# Patient Record
Sex: Female | Born: 1950 | Race: Black or African American | Hispanic: No | Smoking: Never smoker
Health system: Southern US, Community
[De-identification: ages and names within clinical notes are randomized; demographics above are authoritative.]

## PROBLEM LIST (undated history)

## (undated) DIAGNOSIS — E7800 Pure hypercholesterolemia, unspecified: Secondary | ICD-10-CM

## (undated) DIAGNOSIS — K635 Polyp of colon: Secondary | ICD-10-CM

## (undated) DIAGNOSIS — K219 Gastro-esophageal reflux disease without esophagitis: Secondary | ICD-10-CM

## (undated) DIAGNOSIS — M7120 Synovial cyst of popliteal space [Baker], unspecified knee: Secondary | ICD-10-CM

## (undated) DIAGNOSIS — I10 Essential (primary) hypertension: Secondary | ICD-10-CM

## (undated) DIAGNOSIS — M4800 Spinal stenosis, site unspecified: Secondary | ICD-10-CM

## (undated) HISTORY — PX: HERNIA REPAIR: SHX51

## (undated) HISTORY — DX: Gastro-esophageal reflux disease without esophagitis: K21.9

## (undated) HISTORY — PX: BREAST BIOPSY: SHX20

## (undated) HISTORY — DX: Synovial cyst of popliteal space (Baker), unspecified knee: M71.20

## (undated) HISTORY — DX: Polyp of colon: K63.5

## (undated) HISTORY — DX: Spinal stenosis, site unspecified: M48.00

---

## 2003-08-15 ENCOUNTER — Other Ambulatory Visit: Payer: Self-pay

## 2003-11-12 ENCOUNTER — Other Ambulatory Visit: Payer: Self-pay

## 2006-01-11 ENCOUNTER — Emergency Department: Payer: Self-pay | Admitting: Emergency Medicine

## 2006-01-23 ENCOUNTER — Emergency Department: Payer: Self-pay | Admitting: Emergency Medicine

## 2006-02-23 ENCOUNTER — Emergency Department: Payer: Self-pay | Admitting: Emergency Medicine

## 2006-04-29 ENCOUNTER — Emergency Department: Payer: Self-pay | Admitting: Emergency Medicine

## 2006-06-09 ENCOUNTER — Emergency Department: Payer: Self-pay | Admitting: Emergency Medicine

## 2006-07-07 DIAGNOSIS — I10 Essential (primary) hypertension: Secondary | ICD-10-CM | POA: Insufficient documentation

## 2007-04-13 ENCOUNTER — Emergency Department: Payer: Self-pay | Admitting: Emergency Medicine

## 2007-04-29 ENCOUNTER — Emergency Department: Payer: Self-pay | Admitting: Emergency Medicine

## 2007-05-03 ENCOUNTER — Ambulatory Visit: Payer: Self-pay

## 2007-11-06 ENCOUNTER — Emergency Department: Payer: Self-pay | Admitting: Emergency Medicine

## 2008-05-14 ENCOUNTER — Emergency Department: Payer: Self-pay | Admitting: Emergency Medicine

## 2008-07-09 ENCOUNTER — Observation Stay: Payer: Self-pay | Admitting: Specialist

## 2008-08-01 ENCOUNTER — Ambulatory Visit: Payer: Self-pay | Admitting: Internal Medicine

## 2008-08-09 ENCOUNTER — Emergency Department: Payer: Self-pay | Admitting: Emergency Medicine

## 2010-01-12 ENCOUNTER — Ambulatory Visit: Payer: Self-pay | Admitting: Family Medicine

## 2011-03-09 ENCOUNTER — Ambulatory Visit: Payer: Self-pay | Admitting: Family Medicine

## 2011-10-05 ENCOUNTER — Emergency Department: Payer: Self-pay

## 2011-10-05 LAB — BASIC METABOLIC PANEL
Anion Gap: 10 (ref 7–16)
BUN: 11 mg/dL (ref 7–18)
Calcium, Total: 9 mg/dL (ref 8.5–10.1)
Chloride: 104 mmol/L (ref 98–107)
Co2: 24 mmol/L (ref 21–32)
Creatinine: 0.84 mg/dL (ref 0.60–1.30)
EGFR (African American): >60
EGFR (Non-African Amer.): >60
Glucose: 128 mg/dL — ABNORMAL HIGH (ref 65–99)
Osmolality: 277 (ref 275–301)
Potassium: 4.1 mmol/L (ref 3.5–5.1)
Sodium: 138 mmol/L (ref 136–145)

## 2011-10-05 LAB — TROPONIN I: Troponin-I: <0.02 ng/mL

## 2011-10-05 LAB — CBC
HCT: 45.5 % (ref 35.0–47.0)
HGB: 15.1 g/dL (ref 12.0–16.0)
MCH: 31.1 pg (ref 26.0–34.0)
MCHC: 33.3 g/dL (ref 32.0–36.0)
MCV: 93 fL (ref 80–100)
Platelet: 210 10*3/uL (ref 150–440)
RBC: 4.87 10*6/uL (ref 3.80–5.20)
RDW: 13.4 % (ref 11.5–14.5)
WBC: 4.8 10*3/uL (ref 3.6–11.0)

## 2012-08-21 ENCOUNTER — Ambulatory Visit: Payer: Self-pay | Admitting: Nurse Practitioner

## 2012-11-22 DIAGNOSIS — R768 Other specified abnormal immunological findings in serum: Secondary | ICD-10-CM | POA: Insufficient documentation

## 2013-01-17 DIAGNOSIS — M79643 Pain in unspecified hand: Secondary | ICD-10-CM | POA: Insufficient documentation

## 2013-01-17 DIAGNOSIS — K219 Gastro-esophageal reflux disease without esophagitis: Secondary | ICD-10-CM | POA: Insufficient documentation

## 2013-01-31 ENCOUNTER — Ambulatory Visit: Payer: Self-pay | Admitting: Family Medicine

## 2013-04-23 IMAGING — CR DG KNEE COMPLETE 4+V*R*
1 series · 4 of 4 positions shown · non-contrast
Comparison: none

REASON FOR EXAM: KNEE PAIN
COMMENTS:

PROCEDURE:     DXR - DXR KNEE RT COMP WITH OBLIQUES  - March 09, 2011 [DATE]
RESULT:     No fracture, dislocation or other acute bony abnormality is
identified. There is slight arthritic spurring at the knee medially and
laterally. The knee joint space is well maintained. The patella is intact.

[Series 1: view not recorded · 0.17mm/px · 4 of 4 slices shown]
[im 1/4]
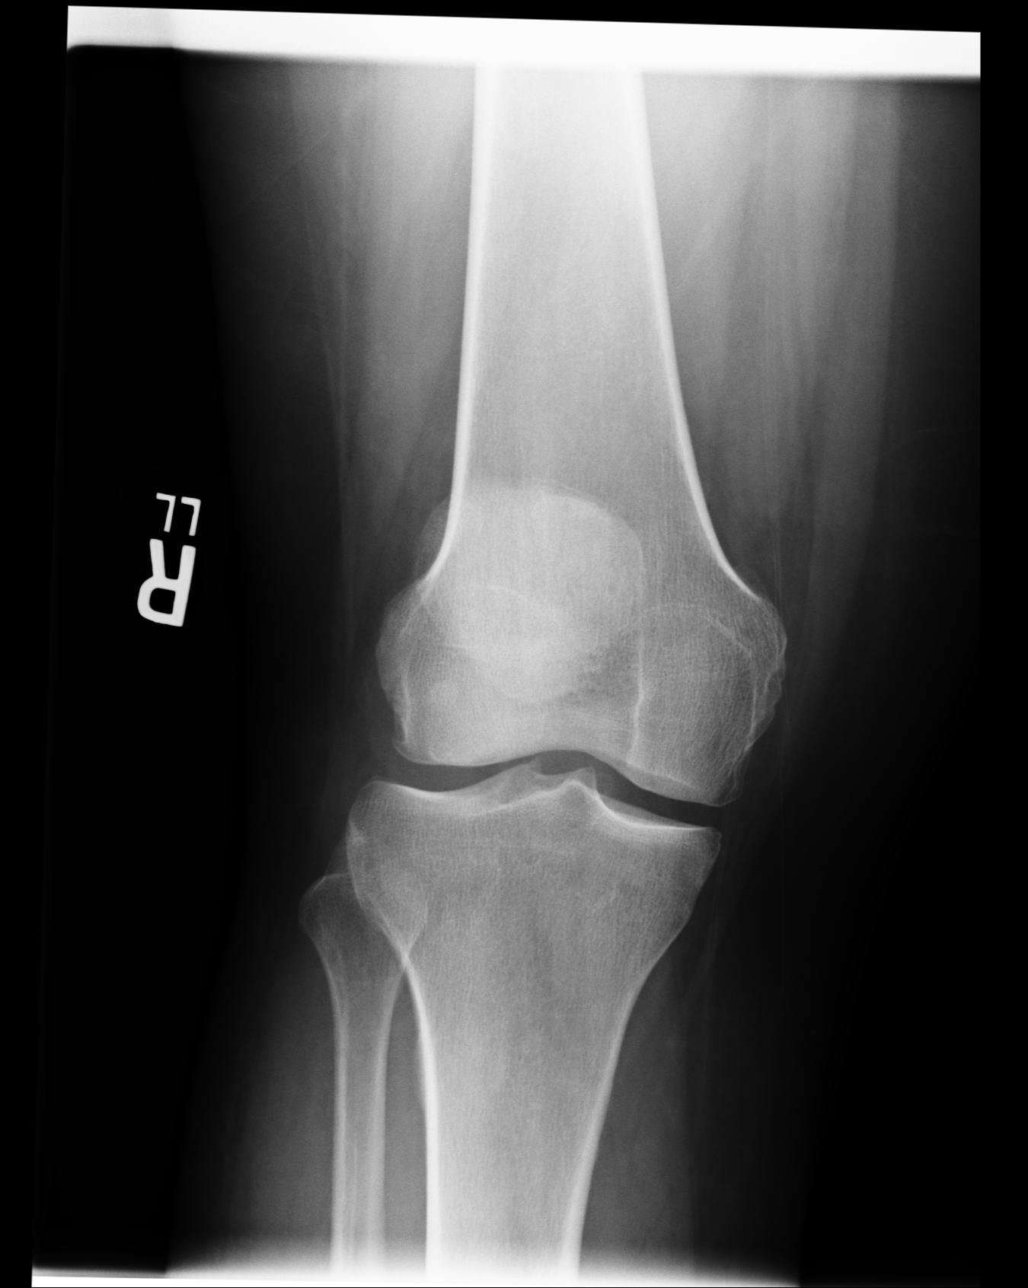
[im 2/4]
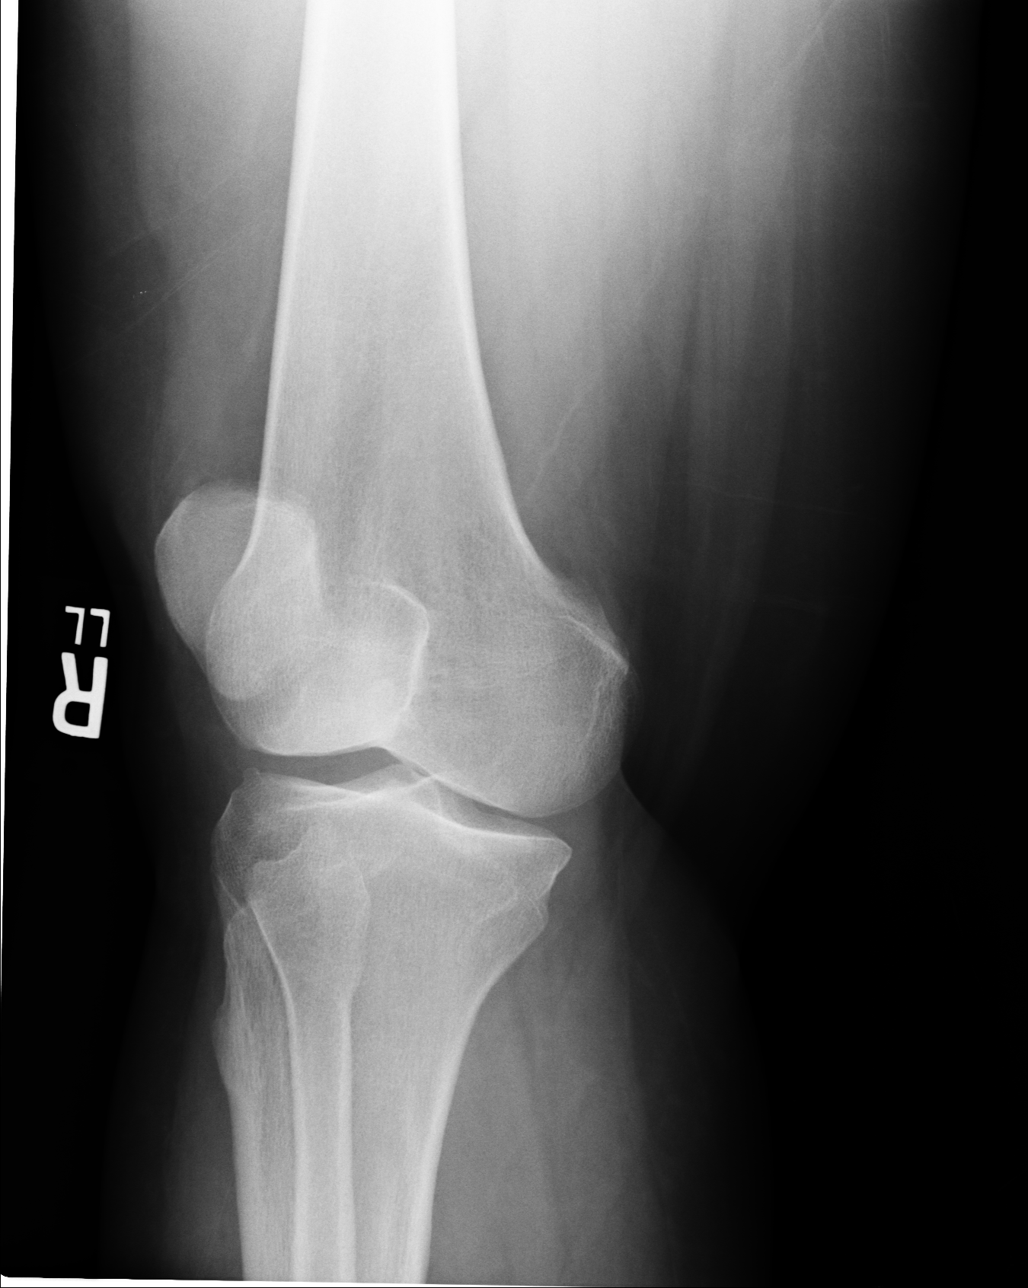
[im 3/4]
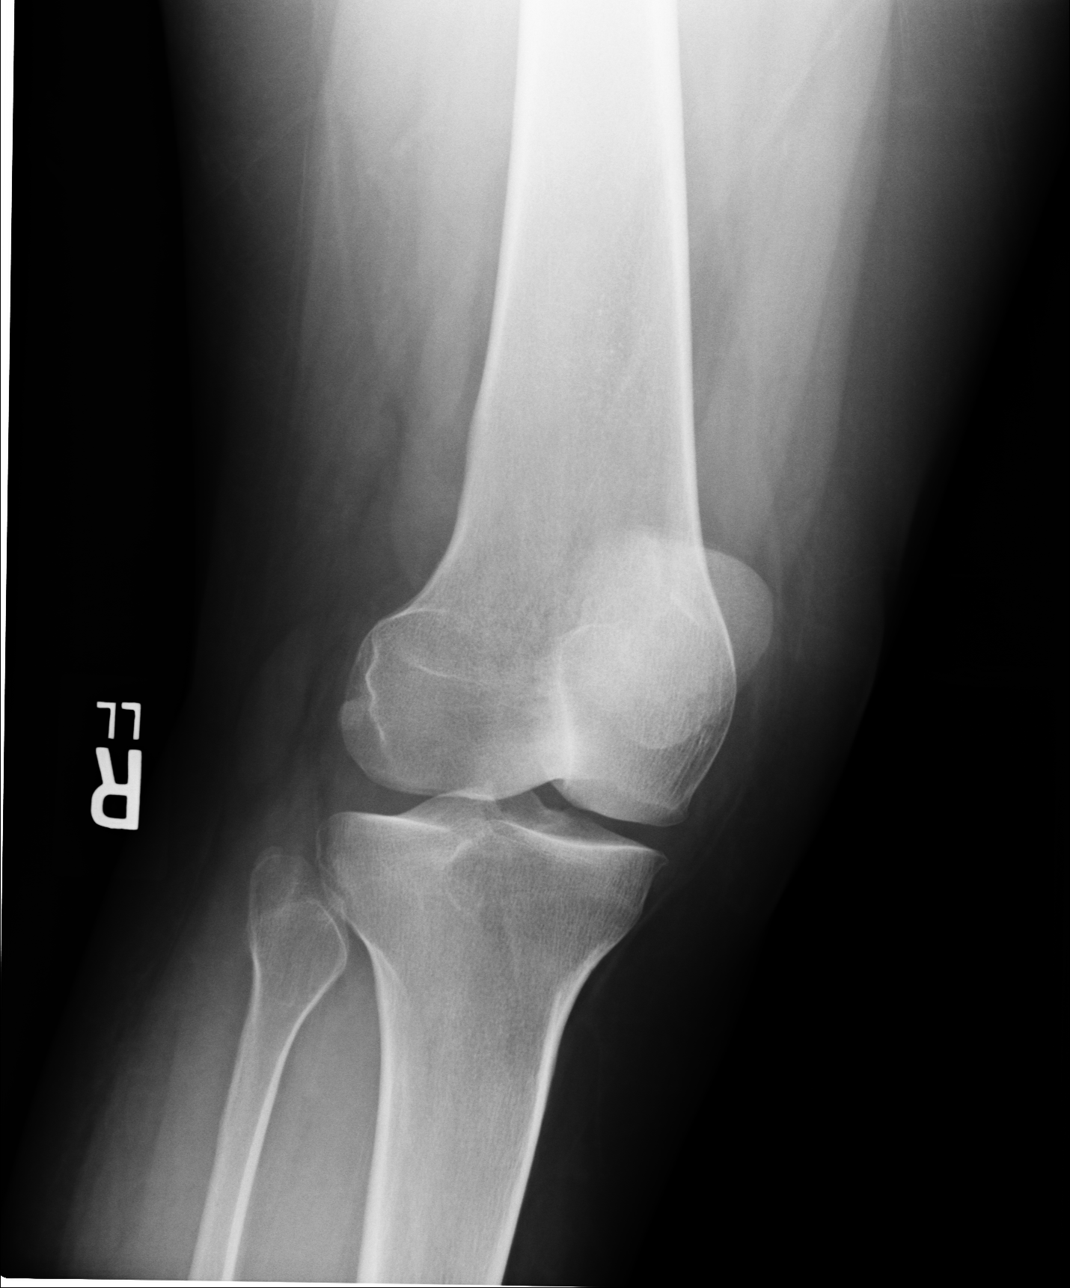
[im 4/4]
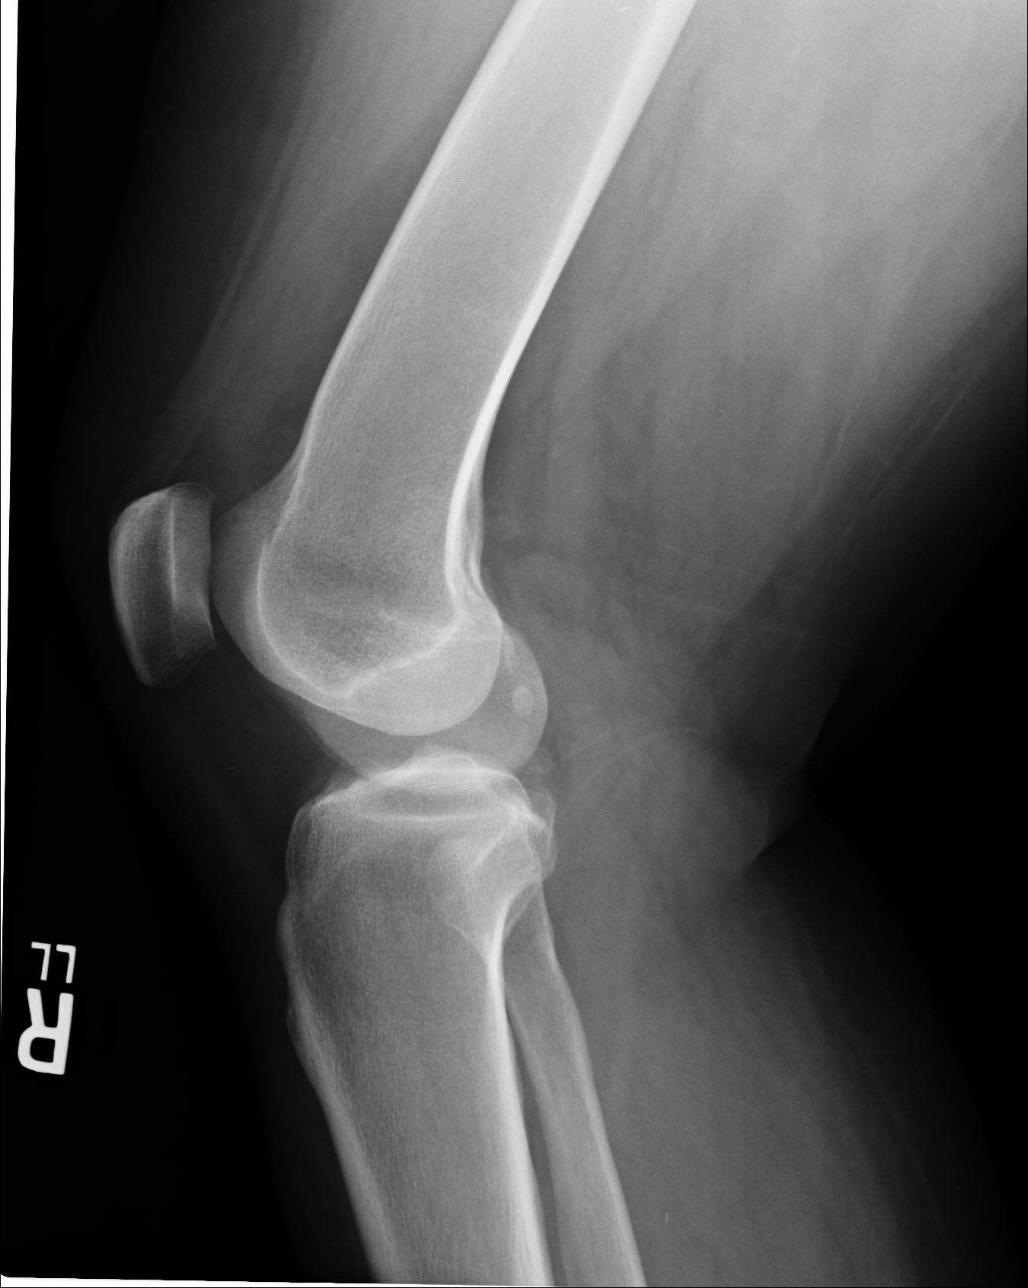

[4 of 4 positions shown; findings below may reference images not displayed]

IMPRESSION: 1. No fracture or other acute bony abnormality is identified.
2. The knee joint space is well-maintained.
3. There is slight arthritic spurring at the knee joint medially and
laterally.

## 2015-01-05 ENCOUNTER — Other Ambulatory Visit: Payer: Self-pay | Admitting: Nurse Practitioner

## 2015-01-05 DIAGNOSIS — Z1239 Encounter for other screening for malignant neoplasm of breast: Secondary | ICD-10-CM

## 2015-01-07 ENCOUNTER — Emergency Department: Payer: No Typology Code available for payment source

## 2015-01-07 ENCOUNTER — Encounter: Payer: Self-pay | Admitting: *Deleted

## 2015-01-07 ENCOUNTER — Emergency Department
Admission: EM | Admit: 2015-01-07 | Discharge: 2015-01-07 | Disposition: A | Discharge status: Home / Self Care | Payer: No Typology Code available for payment source | Attending: Emergency Medicine | Admitting: Emergency Medicine

## 2015-01-07 DIAGNOSIS — I10 Essential (primary) hypertension: Secondary | ICD-10-CM | POA: Diagnosis not present

## 2015-01-07 DIAGNOSIS — S8001XA Contusion of right knee, initial encounter: Secondary | ICD-10-CM

## 2015-01-07 DIAGNOSIS — Y9301 Activity, walking, marching and hiking: Secondary | ICD-10-CM | POA: Insufficient documentation

## 2015-01-07 DIAGNOSIS — S0012XA Contusion of left eyelid and periocular area, initial encounter: Secondary | ICD-10-CM | POA: Insufficient documentation

## 2015-01-07 DIAGNOSIS — Z880 Allergy status to penicillin: Secondary | ICD-10-CM | POA: Diagnosis not present

## 2015-01-07 DIAGNOSIS — S0083XA Contusion of other part of head, initial encounter: Secondary | ICD-10-CM

## 2015-01-07 DIAGNOSIS — Y998 Other external cause status: Secondary | ICD-10-CM | POA: Insufficient documentation

## 2015-01-07 DIAGNOSIS — Y9289 Other specified places as the place of occurrence of the external cause: Secondary | ICD-10-CM | POA: Insufficient documentation

## 2015-01-07 DIAGNOSIS — S0990XA Unspecified injury of head, initial encounter: Secondary | ICD-10-CM | POA: Diagnosis present

## 2015-01-07 DIAGNOSIS — W108XXA Fall (on) (from) other stairs and steps, initial encounter: Secondary | ICD-10-CM | POA: Diagnosis not present

## 2015-01-07 HISTORY — DX: Essential (primary) hypertension: I10

## 2015-01-07 MED ORDER — KETOROLAC TROMETHAMINE 60 MG/2ML IM SOLN
60.0000 mg | Freq: Once | INTRAMUSCULAR | Status: AC
  Administered 2015-01-07: 60 mg via INTRAMUSCULAR
  Filled 2015-01-07: qty 2

## 2015-01-07 MED ORDER — IBUPROFEN 800 MG PO TABS: 800.0000 mg | ORAL_TABLET | Freq: Three times a day (TID) | ORAL | Status: DC | PRN

## 2015-01-07 MED ORDER — TRAMADOL HCL 50 MG PO TABS
50.0000 mg | ORAL_TABLET | Freq: Four times a day (QID) | ORAL | Status: DC | PRN
Start: 2015-01-07 — End: 2015-07-17

## 2015-01-07 NOTE — ED Notes (Signed)
Pt here after falling from standing position.  Pt was walking to her car and her leg "gave out".  Pt has abrasion to left knee and hematoma to forehead. Denis any LOC.

## 2015-01-07 NOTE — ED Provider Notes (Signed)
[facility] Emergency Department Provider Note  ____________________________________________  Time seen: Approximately 1:38 PM  I have reviewed the triage vital signs and the nursing notes.   HISTORY  Chief Complaint Fall    HPI DEDEE Delacruz is a 64 y.o. female plane of pain and hematoma to the forehead and also pain to the right knee secondary to a fall. Patient states she was walking up steps on her right knee gave way and she fell forward landed on the knee and striking her head on the steps. She denies any loss of consciousness but noticed there is a large hematoma above her left eye. Patient denies any vision problems or vertigo. Patient states she takes aspirin every day for blood pressure medication. Patient is rating her pain as a 10 over 10 in the facial area. Patient rated the pain as a 6/10. No palliative measures except for ice applied to the hematoma for this complaint.   Past Medical History  Diagnosis Date  . Hypertension     There are no active problems to display for this patient.   Past Surgical History  Procedure Laterality Date  . Cesarean section    . Hernia repair      Current Outpatient Rx  Name  Route  Sig  Dispense  Refill  . ibuprofen (ADVIL,MOTRIN) 800 MG tablet   Oral   Take 1 tablet (800 mg total) by mouth every 8 (eight) hours as needed for moderate pain.   15 tablet   0   . traMADol (ULTRAM) 50 MG tablet   Oral   Take 1 tablet (50 mg total) by mouth every 6 (six) hours as needed for moderate pain.   12 tablet   0     Allergies Penicillins and Sulfa antibiotics  No family history on file.  Social History History  Substance Use Topics  . Smoking status: Never Smoker   . Smokeless tobacco: Not on file  . Alcohol Use: No    Review of Systems Constitutional: No fever/chills Eyes: No visual changes. ENT: No sore throat. Cardiovascular: Denies chest pain. Respiratory: Denies shortness of  breath. Gastrointestinal: No abdominal pain.  No nausea, no vomiting.  No diarrhea.  No constipation. Genitourinary: Negative for dysuria. Musculoskeletal: Right knee pain Skin: Negative for rash. Hematoma above the left eyebrow. Neurological: Positive for headaches, but denies focal weakness or numbness. Endocrine:Hypertension Hematological/Lymphatic: 10-point ROS otherwise negative.  ____________________________________________   PHYSICAL EXAM:  VITAL SIGNS: ED Triage Vitals  Enc Vitals Group     BP 01/07/15 1249 141/81 mmHg     Pulse Rate 01/07/15 1249 80     Resp --      Temp 01/07/15 1249 98.1 F (36.7 C)     Temp Source 01/07/15 1249 Oral     SpO2 01/07/15 1249 97 %     Weight 01/07/15 1249 240 lb (108.863 kg)     Height 01/07/15 1249 5\' 7"  (1.702 m)     Head Cir --      Peak Flow --      Pain Score 01/07/15 1249 10     Pain Loc --      Pain Edu? --      Excl. in [place]? --     Constitutional: Alert and oriented. Well appearing and in no acute distress. Eyes: Conjunctivae are normal. PERRL. EOMI. Head: 4 cm hematoma above the left eyebrow. Nose: No congestion/rhinnorhea. Mouth/Throat: Mucous membranes are moist.  Oropharynx non-erythematous. Neck: No stridor. No  cervical spine tenderness to palpation. Hematological/Lymphatic/Immunilogical: No cervical lymphadenopathy. Cardiovascular: Normal rate, regular rhythm. Grossly normal heart sounds.  Good peripheral circulation. Respiratory: Normal respiratory effort.  No retractions. Lungs CTAB. Gastrointestinal: Soft and nontender. No distention. No abdominal bruits. No CVA tenderness. Musculoskeletal: Right lower extremity tenderness and edema right anterior knee.  No joint effusions. Neurologic:  Normal speech and language. No gross focal neurologic deficits are appreciated. No gait instability. Skin:  Skin is warm, dry and intact. No rash noted. Abrasions to right knee Psychiatric: Mood and affect are normal. Speech  and behavior are normal.  ____________________________________________   LABS (all labs ordered are listed, but only abnormal results are displayed)  Labs Reviewed - No data to display ____________________________________________  EKG   ____________________________________________  RADIOLOGY  CT scan of grossly unremarkable acute findings. X-ray of the right knee show mild arthritis. ____________________________________________   PROCEDURES  Procedure(s) performed: None  Critical Care performed: No  ____________________________________________   INITIAL IMPRESSION / ASSESSMENT AND PLAN / ED COURSE  Pertinent labs & imaging results that were available during my care of the patient were reviewed by me and considered in my medical decision making (see chart for details).  Facial contusion or hematoma. Right knee contusion. Patient advised on home care. Patient given a prescription for tramadol and ibuprofen to take as directed. Patient given a work excuse for 2 days. [facility] or return to the ER for condition worsens. ____________________________________________   FINAL CLINICAL IMPRESSION(S) / ED DIAGNOSES  Final diagnoses:  Facial contusion, initial encounter  Traumatic hematoma of face, initial encounter  Knee contusion, right, initial encounter      Sable Feil, PA-C 01/07/15 1538  Lisa Roca, MD 01/08/15 [phone]

## 2015-01-07 NOTE — Discharge Instructions (Signed)
Contusion °A contusion is a deep bruise. Contusions happen when an injury causes bleeding under the skin. Signs of bruising include pain, puffiness (swelling), and discolored skin. The contusion may turn blue, purple, or yellow. °HOME CARE  °· Put ice on the injured area. °¨ Put ice in a plastic bag. °¨ Place a towel between your skin and the bag. °¨ Leave the ice on for 15-20 minutes, 03-04 times a day. °· Only take medicine as told by your doctor. °· Rest the injured area. °· If possible, raise (elevate) the injured area to lessen puffiness. °GET HELP RIGHT AWAY IF:  °· You have more bruising or puffiness. °· You have pain that is getting worse. °· Your puffiness or pain is not helped by medicine. °MAKE SURE YOU:  °· Understand these instructions. °· Will watch your condition. °· Will get help right away if you are not doing well or get worse. °Document Released: 11/16/2007 Document Revised: 08/22/2011 Document Reviewed: 04/04/2011 °ExitCare® Patient Information ©2015 [employer]. This information is not intended to replace advice given to you by your health care provider. Make sure you discuss any questions you have with your health care provider. ° °

## 2015-07-17 ENCOUNTER — Emergency Department
Admission: EM | Admit: 2015-07-17 | Discharge: 2015-07-17 | Disposition: A | Discharge status: Home / Self Care | Payer: BLUE CROSS/BLUE SHIELD | Attending: Emergency Medicine | Admitting: Emergency Medicine

## 2015-07-17 ENCOUNTER — Emergency Department: Payer: BLUE CROSS/BLUE SHIELD

## 2015-07-17 ENCOUNTER — Encounter: Payer: Self-pay | Admitting: Emergency Medicine

## 2015-07-17 DIAGNOSIS — I10 Essential (primary) hypertension: Secondary | ICD-10-CM | POA: Insufficient documentation

## 2015-07-17 DIAGNOSIS — R52 Pain, unspecified: Secondary | ICD-10-CM

## 2015-07-17 DIAGNOSIS — M79605 Pain in left leg: Secondary | ICD-10-CM

## 2015-07-17 DIAGNOSIS — Z880 Allergy status to penicillin: Secondary | ICD-10-CM | POA: Diagnosis not present

## 2015-07-17 DIAGNOSIS — M25562 Pain in left knee: Secondary | ICD-10-CM | POA: Diagnosis present

## 2015-07-17 DIAGNOSIS — R609 Edema, unspecified: Secondary | ICD-10-CM

## 2015-07-17 DIAGNOSIS — M1712 Unilateral primary osteoarthritis, left knee: Secondary | ICD-10-CM | POA: Diagnosis not present

## 2015-07-17 MED ORDER — HYDROCODONE-ACETAMINOPHEN 5-325 MG PO TABS: 1.0000 | ORAL_TABLET | ORAL | Status: DC | PRN

## 2015-07-17 MED ORDER — IBUPROFEN 600 MG PO TABS
600.0000 mg | ORAL_TABLET | Freq: Once | ORAL | Status: AC
  Administered 2015-07-17: 600 mg via ORAL
  Filled 2015-07-17: qty 1

## 2015-07-17 MED ORDER — MELOXICAM 15 MG PO TABS: 15.0000 mg | ORAL_TABLET | Freq: Every day | ORAL | Status: DC

## 2015-07-17 NOTE — Discharge Instructions (Signed)
Arthritis Arthritis means joint pain. It can also mean joint disease. A joint is a place where bones come together. People who have arthritis may have:  Red joints.  Swollen joints.  Stiff joints.  Warm joints.  A fever.  A feeling of being sick. HOME CARE Pay attention to any changes in your symptoms. Take these actions to help with your pain and swelling. Medicines  Take over-the-counter and prescription medicines only as told by your doctor.  Do not take aspirin for pain if your doctor says that you may have gout. Activities  Rest your joint if your doctor tells you to.  Avoid activities that make the pain worse.  Exercise your joint regularly as told by your doctor. Try doing exercises like:  Swimming.  Water aerobics.  Biking.  Walking. Joint Care  If your joint is swollen, keep it raised (elevated) if told by your doctor.  If your joint feels stiff in the morning, try taking a warm shower.  If you have diabetes, do not apply heat without asking your doctor.  If told, apply heat to the joint:  Put a towel between the joint and the hot pack or heating pad.  Leave the heat on the area for 20-30 minutes.  If told, apply ice to the joint:  Put ice in a plastic bag.  Place a towel between your skin and the bag.  Leave the ice on for 20 minutes, 2-3 times per day.  Keep all follow-up visits as told by your doctor. GET HELP IF:  The pain gets worse.  You have a fever. GET HELP RIGHT AWAY IF:  You have very bad pain in your joint.  You have swelling in your joint.  Your joint is red.  Many joints become painful and swollen.  You have very bad back pain.  Your leg is very weak.  You cannot control your pee (urine) or poop (stool).   This information is not intended to replace advice given to you by your health care provider. Make sure you discuss any questions you have with your health care provider.   Document Released: 08/24/2009  Document Revised: 02/18/2015 Document Reviewed: 08/25/2014 Elsevier Interactive Patient Education 2016 [place] taking meloxicam for arthritis daily with food. Norco as needed for severe pain. Follow-up with your doctor for any continued problems.

## 2015-07-17 NOTE — ED Notes (Signed)
Sent in from [facility] with left knee pain  States pain started about 3-4 days ago w/o injury  Became worse today  Unable to  [place] wt

## 2015-07-17 NOTE — ED Notes (Signed)
Left knee swollen and unable to bear wt  Positive pulses and good sensation

## 2015-07-17 NOTE — ED Provider Notes (Signed)
[facility] Emergency Department Provider Note ____________________________________________  Time seen: Approximately 1:03 PM  I have reviewed the triage vital signs and the nursing notes.   HISTORY  Chief Complaint Knee Pain   HPI Debra Delacruz is a 65 y.o. female is here complaining of left knee swollen. Patient states she was seen at [facility] today and sent to the emergency room for evaluation. Patient states it is very painful to bear weight. She denies any injury to her leg especially her left knee. She states this pain started approximately 3-4 days ago. Patient is not a smoker and has not traveled recently. She denies smoking and there is been no history of DVTs. Patient describes her pain as being more on the medial aspect anteriorly of her left knee.Currently she rates her pain as a 10 out of 10. Patient drove herself to the emergency room today and was ambulatory to triage.   Past Medical History  Diagnosis Date  . Hypertension     There are no active problems to display for this patient.   Past Surgical History  Procedure Laterality Date  . Cesarean section    . Hernia repair      Current Outpatient Rx  Name  Route  Sig  Dispense  Refill  . HYDROcodone-acetaminophen (NORCO/VICODIN) 5-325 MG tablet   Oral   Take 1 tablet by mouth every 4 (four) hours as needed for moderate pain.   20 tablet   0   . meloxicam (MOBIC) 15 MG tablet   Oral   Take 1 tablet (15 mg total) by mouth daily.   30 tablet   2     Allergies Penicillins and Sulfa antibiotics  No family history on file.  Social History Social History  Substance Use Topics  . Smoking status: Never Smoker   . Smokeless tobacco: None  . Alcohol Use: No    Review of Systems Constitutional: No fever/chills Cardiovascular: Denies chest pain. Respiratory: Denies shortness of breath. Gastrointestinal:  No nausea, no vomiting.   Musculoskeletal: Negative for back pain.  Positive for left knee pain. Skin: Negative for rash. Neurological: Negative for headaches, focal weakness or numbness.  10-point ROS otherwise negative.  ____________________________________________   PHYSICAL EXAM:  VITAL SIGNS: ED Triage Vitals  Enc Vitals Group     BP 07/17/15 1236 135/79 mmHg     Pulse Rate 07/17/15 1236 78     Resp 07/17/15 1236 18     Temp 07/17/15 1236 98.6 F (37 C)     Temp src --      SpO2 07/17/15 1236 98 %     Weight 07/17/15 1236 240 lb (108.863 kg)     Height 07/17/15 1236 5\' 7"  (1.702 m)     Head Cir --      Peak Flow --      Pain Score 07/17/15 1237 10     Pain Loc --      Pain Edu? --      Excl. in [place]? --     Constitutional: Alert and oriented. Well appearing and in no acute distress. Eyes: Conjunctivae are normal. PERRL. EOMI. Head: Atraumatic. Nose: No congestion/rhinnorhea. Neck: No stridor.   Cardiovascular: Normal rate, regular rhythm. Grossly normal heart sounds.  Good peripheral circulation. Respiratory: Normal respiratory effort.  No retractions. Lungs CTAB. Gastrointestinal: Soft and nontender. No distention.  Musculoskeletal: Left knee no gross deformity was noted. There is moderate tenderness on palpation of the anterior medial aspect of the  knee soft tissue to palpation. There is no warmth or redness noted. There is no calf pain on palpation. Range of motion of the knee is restricted secondary to patient's pain. Pulse is positive distally and motor sensory function intact. There is no evidence of abrasion, ecchymosis or erythema. Skin is intact. In comparison to the right leg left leg is slightly edematous but no pitting edema is appreciated. Neurologic:  Normal speech and language. No gross focal neurologic deficits are appreciated.  Skin:  Skin is warm, dry and intact. No rash noted. Psychiatric: Mood and affect are normal. Speech and behavior are normal.  ____________________________________________   LABS (all labs  ordered are listed, but only abnormal results are displayed)  Labs Reviewed - No data to display  RADIOLOGY  Ultrasound of the left lower extremity shows no evidence of DVT per radiologist. Left knee x-ray for radiologist shows mild degenerative joint disease especially on the medial aspect. ____________________________________________   PROCEDURES  Procedure(s) performed: None  Critical Care performed: No  ____________________________________________   INITIAL IMPRESSION / ASSESSMENT AND PLAN / ED COURSE  Pertinent labs & imaging results that were available during my care of the patient were reviewed by me and considered in my medical decision making (see chart for details).  Patient was started on Motrin 8:15 milligrams one daily with food and given Norco as needed for severe pain. Patient is aware that this medication may cause drowsiness. She is to follow-up with her doctor at [facility] clinic if any continued problems. ____________________________________________   FINAL CLINICAL IMPRESSION(S) / ED DIAGNOSES  Final diagnoses:  Swelling  Pain  Left leg pain  Arthritis of left knee      Johnn Hai, PA-C 07/17/15 1529  Daymon Larsen, MD 07/17/15 [phone]

## 2016-01-08 ENCOUNTER — Emergency Department
Admission: EM | Admit: 2016-01-08 | Discharge: 2016-01-08 | Disposition: A | Discharge status: Home / Self Care | Payer: BLUE CROSS/BLUE SHIELD | Attending: Emergency Medicine | Admitting: Emergency Medicine

## 2016-01-08 ENCOUNTER — Emergency Department: Payer: BLUE CROSS/BLUE SHIELD

## 2016-01-08 ENCOUNTER — Encounter: Payer: Self-pay | Admitting: Emergency Medicine

## 2016-01-08 DIAGNOSIS — Y929 Unspecified place or not applicable: Secondary | ICD-10-CM | POA: Insufficient documentation

## 2016-01-08 DIAGNOSIS — S9032XA Contusion of left foot, initial encounter: Secondary | ICD-10-CM

## 2016-01-08 DIAGNOSIS — I10 Essential (primary) hypertension: Secondary | ICD-10-CM | POA: Insufficient documentation

## 2016-01-08 DIAGNOSIS — Y999 Unspecified external cause status: Secondary | ICD-10-CM | POA: Insufficient documentation

## 2016-01-08 DIAGNOSIS — Z79899 Other long term (current) drug therapy: Secondary | ICD-10-CM | POA: Insufficient documentation

## 2016-01-08 DIAGNOSIS — Y9389 Activity, other specified: Secondary | ICD-10-CM | POA: Insufficient documentation

## 2016-01-08 DIAGNOSIS — W208XXA Other cause of strike by thrown, projected or falling object, initial encounter: Secondary | ICD-10-CM | POA: Insufficient documentation

## 2016-01-08 MED ORDER — HYDROCODONE-ACETAMINOPHEN 5-325 MG PO TABS: 1.0000 | ORAL_TABLET | ORAL | 0 refills | Status: DC | PRN

## 2016-01-08 MED ORDER — MELOXICAM 15 MG PO TABS: 15.0000 mg | ORAL_TABLET | Freq: Every day | ORAL | 0 refills | Status: DC

## 2016-01-08 NOTE — ED Notes (Signed)
States she dropped a head board onto foot this afternoon  Having pain across the top of foot  Increased pain with ambulation

## 2016-01-08 NOTE — ED Triage Notes (Signed)
Pt dropped headboard on foot trying to move it. Swelling and pain to left foot.

## 2016-01-08 NOTE — ED Provider Notes (Signed)
[facility] Emergency Department Provider Note  ____________________________________________  Time seen: Approximately 6:40 PM  I have reviewed the triage vital signs and the nursing notes.   HISTORY  Chief Complaint Foot Pain    HPI Debra Delacruz is a 65 y.o. female who presents emergency department complaining of left foot pain. Patient states that she was moving a very large head board when it slipped out of her hands and landed on the dorsal aspect of the left foot. Patient reports swelling and mild ecchymosis. Patient reports being able to move all digits appropriately. She is able to move the ankle appropriately. Patient denies any other injury or complaint. She has not had any medications for this complaint prior to arrival. Pain is sharp, constant, worse with ambulation or weightbearing.   Past Medical History:  Diagnosis Date  . Hypertension     There are no active problems to display for this patient.   Past Surgical History:  Procedure Laterality Date  . CESAREAN SECTION    . HERNIA REPAIR      Prior to Admission medications   Medication Sig Start Date End Date Taking? Authorizing Provider  amLODipine (NORVASC) 5 MG tablet Take 5 mg by mouth daily.   Yes Historical Provider, MD  carvedilol (COREG) 12.5 MG tablet Take 12.5 mg by mouth 2 (two) times daily with a meal.   Yes Historical Provider, MD  pravastatin (PRAVACHOL) 20 MG tablet Take 20 mg by mouth daily.   Yes Historical Provider, MD  pseudoephedrine-acetaminophen (TYLENOL SINUS) 30-500 MG TABS tablet Take 1 tablet by mouth every 4 (four) hours as needed.   Yes Historical Provider, MD  HYDROcodone-acetaminophen (NORCO/VICODIN) 5-325 MG tablet Take 1 tablet by mouth every 4 (four) hours as needed for moderate pain. 01/08/16   Charline Bills Evelise Reine, PA-C  meloxicam (MOBIC) 15 MG tablet Take 1 tablet (15 mg total) by mouth daily. 01/08/16   Charline Bills Wisam Siefring, PA-C     Allergies Penicillins and Sulfa antibiotics  History reviewed. No pertinent family history.  Social History Social History  Substance Use Topics  . Smoking status: Never Smoker  . Smokeless tobacco: Not on file  . Alcohol use No     Review of Systems  Constitutional: No fever/chills Cardiovascular: no chest pain. Respiratory: no cough. No SOB. Musculoskeletal: Positive for left foot pain. Skin: Negative for rash, abrasions, lacerations, ecchymosis. Neurological: Negative for headaches, focal weakness or numbness. 10-point ROS otherwise negative.  ____________________________________________   PHYSICAL EXAM:  VITAL SIGNS: ED Triage Vitals  Enc Vitals Group     BP 01/08/16 1804 (!) 143/83     Pulse Rate 01/08/16 1804 62     Resp 01/08/16 1804 16     Temp 01/08/16 1804 98.3 F (36.8 C)     Temp Source 01/08/16 1804 Oral     SpO2 01/08/16 1804 99 %     Weight 01/08/16 1804 230 lb (104.3 kg)     Height 01/08/16 1804 5\' 7"  (1.702 m)     Head Circumference --      Peak Flow --      Pain Score 01/08/16 1803 10     Pain Loc --      Pain Edu? --      Excl. in [place]? --      Constitutional: Alert and oriented. Well appearing and in no acute distress. Eyes: Conjunctivae are normal. PERRL. EOMI. Head: Atraumatic. Cardiovascular: Normal rate, regular rhythm. Normal S1 and S2.  Good peripheral  circulation. Respiratory: Normal respiratory effort without tachypnea or retractions. Lungs CTAB. Good air entry to the bases with no decreased or absent breath sounds. Musculoskeletal: Full range of motion to all extremities. No gross deformities appreciated.Edema is noted to mid foot left foot. Underlying ecchymosis is appreciated. Area is very tender to palpation. No palpable abnormalities over the tarsal or metatarsal bones. Full range of motion 5 digits of the left foot. Sensation and cap refill intact 5 digits. There is a hospitalist pulse intact. Neurologic:  Normal speech  and language. No gross focal neurologic deficits are appreciated.  Skin:  Skin is warm, dry and intact. No rash noted. Psychiatric: Mood and affect are normal. Speech and behavior are normal. Patient exhibits appropriate insight and judgement.   ____________________________________________   LABS (all labs ordered are listed, but only abnormal results are displayed)  Labs Reviewed - No data to display ____________________________________________  EKG   ____________________________________________  RADIOLOGY [facility] Tanika Bracco, personally viewed and evaluated these images (plain radiographs) as part of my medical decision making, as well as reviewing the written report by the radiologist.  Dg Foot Complete Left  Result Date: 01/08/2016 CLINICAL DATA:  Head board fell on foot while moving furniture today. Progressive pain despite ice and elevation. EXAM: LEFT FOOT - COMPLETE 3+ VIEW COMPARISON:  Left ankle radiographs 04/29/2006 FINDINGS: Postsurgical changes of distal first metatarsal are noted. Soft tissue swelling is evident over the dorsum of the foot without an underlying acute fracture. The joints are located. No radiopaque foreign body is present. IMPRESSION: 1. Soft tissue swelling over the dorsum of the foot without an underlying fracture. 2. Postoperative changes of the distal first metatarsal. Electronically Signed   By: San Morelle M.D.   On: 01/08/2016 18:32   ____________________________________________    PROCEDURES  Procedure(s) performed:    Procedures    Medications - No data to display   ____________________________________________   INITIAL IMPRESSION / ASSESSMENT AND PLAN / ED COURSE  Pertinent labs & imaging results that were available during my care of the patient were reviewed by me and considered in my medical decision making (see chart for details).  Clinical Course    Patient's diagnosis is consistent with Left foot  contusion. X-ray reveals no acute osseous abnormality.. Patient will be discharged home with prescriptions for anti-inflammatories and limited pain medication. Patient is to follow up with primary care or orthopedics as needed or otherwise directed. Patient is given ED precautions to return to the ED for any worsening or new symptoms.     ____________________________________________  FINAL CLINICAL IMPRESSION(S) / ED DIAGNOSES  Final diagnoses:  Foot contusion, left, initial encounter      NEW MEDICATIONS STARTED DURING THIS VISIT:  New Prescriptions   HYDROCODONE-ACETAMINOPHEN (NORCO/VICODIN) 5-325 MG TABLET    Take 1 tablet by mouth every 4 (four) hours as needed for moderate pain.   MELOXICAM (MOBIC) 15 MG TABLET    Take 1 tablet (15 mg total) by mouth daily.        This chart was dictated using voice recognition software/Dragon. Despite best efforts to proofread, errors can occur which can change the meaning. Any change was purely unintentional.    Darletta Moll, PA-C 01/08/16 [place], MD 01/08/16 2308

## 2016-07-27 DIAGNOSIS — M48062 Spinal stenosis, lumbar region with neurogenic claudication: Secondary | ICD-10-CM | POA: Insufficient documentation

## 2016-12-23 ENCOUNTER — Other Ambulatory Visit: Payer: Self-pay | Admitting: Family Medicine

## 2016-12-23 DIAGNOSIS — Z1239 Encounter for other screening for malignant neoplasm of breast: Secondary | ICD-10-CM

## 2016-12-23 DIAGNOSIS — Z13820 Encounter for screening for osteoporosis: Secondary | ICD-10-CM

## 2017-02-03 ENCOUNTER — Other Ambulatory Visit: Payer: Self-pay | Admitting: Family Medicine

## 2017-02-03 DIAGNOSIS — M545 Low back pain: Secondary | ICD-10-CM

## 2017-02-03 DIAGNOSIS — R29898 Other symptoms and signs involving the musculoskeletal system: Secondary | ICD-10-CM

## 2017-02-20 ENCOUNTER — Other Ambulatory Visit: Payer: BLUE CROSS/BLUE SHIELD

## 2017-02-22 ENCOUNTER — Ambulatory Visit
Admission: RE | Admit: 2017-02-22 | Discharge: 2017-02-22 | Disposition: A | Discharge status: Home / Self Care | Payer: Medicare Other | Source: Ambulatory Visit | Attending: Family Medicine | Admitting: Family Medicine

## 2017-02-22 DIAGNOSIS — M545 Low back pain: Secondary | ICD-10-CM | POA: Diagnosis present

## 2017-02-22 DIAGNOSIS — M48061 Spinal stenosis, lumbar region without neurogenic claudication: Secondary | ICD-10-CM | POA: Diagnosis not present

## 2017-02-22 DIAGNOSIS — R29898 Other symptoms and signs involving the musculoskeletal system: Secondary | ICD-10-CM | POA: Diagnosis present

## 2017-02-22 DIAGNOSIS — M5126 Other intervertebral disc displacement, lumbar region: Secondary | ICD-10-CM | POA: Insufficient documentation

## 2017-02-22 DIAGNOSIS — M4807 Spinal stenosis, lumbosacral region: Secondary | ICD-10-CM | POA: Diagnosis not present

## 2017-03-27 ENCOUNTER — Other Ambulatory Visit: Payer: Self-pay

## 2018-02-23 DIAGNOSIS — H269 Unspecified cataract: Secondary | ICD-10-CM | POA: Insufficient documentation

## 2018-02-27 ENCOUNTER — Other Ambulatory Visit: Payer: Self-pay | Admitting: Family Medicine

## 2018-02-27 DIAGNOSIS — E010 Iodine-deficiency related diffuse (endemic) goiter: Secondary | ICD-10-CM

## 2018-02-28 ENCOUNTER — Other Ambulatory Visit: Payer: Self-pay | Admitting: Family Medicine

## 2018-02-28 DIAGNOSIS — Z1231 Encounter for screening mammogram for malignant neoplasm of breast: Secondary | ICD-10-CM

## 2018-07-28 ENCOUNTER — Emergency Department: Payer: Medicare HMO

## 2018-07-28 ENCOUNTER — Emergency Department
Admission: EM | Admit: 2018-07-28 | Discharge: 2018-07-28 | Disposition: A | Discharge status: Home / Self Care | Payer: Medicare HMO | Attending: Emergency Medicine | Admitting: Emergency Medicine

## 2018-07-28 ENCOUNTER — Other Ambulatory Visit: Payer: Self-pay

## 2018-07-28 DIAGNOSIS — B9789 Other viral agents as the cause of diseases classified elsewhere: Secondary | ICD-10-CM | POA: Diagnosis not present

## 2018-07-28 DIAGNOSIS — J069 Acute upper respiratory infection, unspecified: Secondary | ICD-10-CM | POA: Insufficient documentation

## 2018-07-28 DIAGNOSIS — Z79899 Other long term (current) drug therapy: Secondary | ICD-10-CM | POA: Insufficient documentation

## 2018-07-28 DIAGNOSIS — I10 Essential (primary) hypertension: Secondary | ICD-10-CM | POA: Diagnosis not present

## 2018-07-28 DIAGNOSIS — R05 Cough: Secondary | ICD-10-CM | POA: Diagnosis present

## 2018-07-28 MED ORDER — METHYLPREDNISOLONE SODIUM SUCC 40 MG IJ SOLR
40.0000 mg | Freq: Once | INTRAMUSCULAR | Status: AC
  Administered 2018-07-28: 40 mg via INTRAMUSCULAR
  Filled 2018-07-28: qty 1

## 2018-07-28 MED ORDER — METHYLPREDNISOLONE 4 MG PO TBPK: ORAL_TABLET | ORAL | 0 refills | Status: DC

## 2018-07-28 MED ORDER — PROMETHAZINE-DM 6.25-15 MG/5ML PO SYRP: 5.0000 mL | ORAL_SOLUTION | Freq: Four times a day (QID) | ORAL | 0 refills | Status: DC | PRN

## 2018-07-28 MED ORDER — BENZONATATE 100 MG PO CAPS
200.0000 mg | ORAL_CAPSULE | Freq: Once | ORAL | Status: AC
  Administered 2018-07-28: 200 mg via ORAL
  Filled 2018-07-28: qty 2

## 2018-07-28 NOTE — ED Triage Notes (Signed)
Patient reports cough and nasal congestion for 3 days.

## 2018-07-28 NOTE — ED Provider Notes (Signed)
[facility] Emergency Department Provider Note   ____________________________________________   First MD Initiated Contact with Patient 07/28/18 [phone]     (approximate)  I have reviewed the triage vital signs and the nursing notes.   HISTORY  Chief Complaint Cough and Nasal Congestion    HPI Debra Delacruz is a 68 y.o. female   patient presents with 3 days of cough and congestion.  Patient states there is mild wheezing.  Patient denies fever/chills.  Patient denies nausea, vomiting, diarrhea.  Patient rates her pain/discomfort 7/10.  Patient describes her pain/discomfort as "achy".  No palliative measure for complaint.   Past Medical History:  Diagnosis Date  . Hypertension     There are no active problems to display for this patient.   Past Surgical History:  Procedure Laterality Date  . CESAREAN SECTION    . HERNIA REPAIR      Prior to Admission medications   Medication Sig Start Date End Date Taking? Authorizing Provider  amLODipine (NORVASC) 5 MG tablet Take 5 mg by mouth daily.    [provider]  carvedilol (COREG) 12.5 MG tablet Take 12.5 mg by mouth 2 (two) times daily with a meal.    [provider]  HYDROcodone-acetaminophen (NORCO/VICODIN) 5-325 MG tablet Take 1 tablet by mouth every 4 (four) hours as needed for moderate pain. 01/08/16   Cuthriell, Charline Bills, PA-C  meloxicam (MOBIC) 15 MG tablet Take 1 tablet (15 mg total) by mouth daily. 01/08/16   Cuthriell, Charline Bills, PA-C  methylPREDNISolone (MEDROL DOSEPAK) 4 MG TBPK tablet Take Tapered dose as directed 07/28/18   Sable Feil, PA-C  pravastatin (PRAVACHOL) 20 MG tablet Take 20 mg by mouth daily.    [provider]  promethazine-dextromethorphan (PROMETHAZINE-DM) 6.25-15 MG/5ML syrup Take 5 mLs by mouth 4 (four) times daily as needed for cough. 07/28/18   Sable Feil, PA-C  pseudoephedrine-acetaminophen (TYLENOL SINUS) 30-500 MG TABS tablet Take 1  tablet by mouth every 4 (four) hours as needed.    [provider]    Allergies Penicillins and Sulfa antibiotics  No family history on file.  Social History Social History   Tobacco Use  . Smoking status: Never Smoker  Substance Use Topics  . Alcohol use: No  . Drug use: No    Review of Systems Constitutional: No fever/chills Eyes: No visual changes. ENT: No sore throat.  Nasal congestion. Cardiovascular: Denies chest pain. Respiratory: Denies shortness of breath.  Nonproductive cough. Gastrointestinal: No abdominal pain.  No nausea, no vomiting.  No diarrhea.  No constipation. Genitourinary: Negative for dysuria. Musculoskeletal: Negative for back pain. Skin: Negative for rash. Neurological: Negative for headaches, focal weakness or numbness. Endocrine:  Hypertension Allergic/Immunilogical: Penicillin and sulfa antibiotics. ____________________________________________   PHYSICAL EXAM:  VITAL SIGNS: ED Triage Vitals  Enc Vitals Group     BP 07/28/18 0622 (!) 145/86     Pulse Rate 07/28/18 0622 83     Resp 07/28/18 0622 18     Temp 07/28/18 0622 97.8 F (36.6 C)     Temp Source 07/28/18 0622 Oral     SpO2 07/28/18 0622 100 %     Weight 07/28/18 0621 240 lb (108.9 kg)     Height 07/28/18 0621 5\' 7"  (1.702 m)     Head Circumference --      Peak Flow --      Pain Score 07/28/18 0622 7     Pain Loc --  Pain Edu? --      Excl. in [place]? --     Constitutional: Alert and oriented. Well appearing and in no acute distress. Nose: Edematous nasal turbinates. Mouth/Throat: Mucous membranes are moist.  Oropharynx non-erythematous.  Postnasal drainage. Neck: No stridor.   Hematological/Lymphatic/Immunilogical: No cervical lymphadenopathy. Cardiovascular: Normal rate, regular rhythm. Grossly normal heart sounds.  Good peripheral circulation. Respiratory: Normal respiratory effort.  No retractions. Lungs CTAB.  Nonproductive cough. Skin:  Skin is warm, dry and  intact. No rash noted. Psychiatric: Mood and affect are normal. Speech and behavior are normal.  ____________________________________________   LABS (all labs ordered are listed, but only abnormal results are displayed)  Labs Reviewed - No data to display ____________________________________________  EKG   ____________________________________________  RADIOLOGY  ED MD interpretation:    Official radiology report(s): Dg Chest 2 View  Result Date: 07/28/2018 CLINICAL DATA:  Cough for 3 days EXAM: CHEST - 2 VIEW COMPARISON:  August 09, 2008 FINDINGS: There is no appreciable edema or consolidation. The heart size and pulmonary vascularity are normal. No adenopathy. There is degenerative change in the lower thoracic region. IMPRESSION: No edema or consolidation. Electronically Signed   By: Lowella Grip III M.D.   On: 07/28/2018 06:59    ____________________________________________   PROCEDURES  Procedure(s) performed: None  Procedures  Critical Care performed: No  ____________________________________________   INITIAL IMPRESSION / ASSESSMENT AND PLAN / ED COURSE  As part of my medical decision making, I reviewed the following data within the [place]     Patient presents with 3 days of nasal congestion and cough consistent with viral respiratory infection.  Patient given discharge care instruction advised take medication as directed.      ____________________________________________   FINAL CLINICAL IMPRESSION(S) / ED DIAGNOSES  Final diagnoses:  Viral URI with cough     ED Discharge Orders         Ordered    promethazine-dextromethorphan (PROMETHAZINE-DM) 6.25-15 MG/5ML syrup  4 times daily PRN     07/28/18 0739    methylPREDNISolone (MEDROL DOSEPAK) 4 MG TBPK tablet     07/28/18 7322           Note:  This document was prepared using Dragon voice recognition software and may include unintentional dictation errors.      Sable Feil, PA-C 07/28/18 0254    Arta Silence, MD 07/28/18 [phone]

## 2018-07-28 NOTE — Discharge Instructions (Addendum)
Your chest x-ray is negative for bronchitis pneumonia.  Follow discharge care instruction take medication as directed.

## 2018-11-26 ENCOUNTER — Other Ambulatory Visit: Payer: Self-pay | Admitting: Family Medicine

## 2018-11-26 ENCOUNTER — Ambulatory Visit
Admission: RE | Admit: 2018-11-26 | Discharge: 2018-11-26 | Disposition: A | Discharge status: Home / Self Care | Payer: Medicare HMO | Source: Ambulatory Visit | Attending: Family Medicine | Admitting: Family Medicine

## 2018-11-26 DIAGNOSIS — R52 Pain, unspecified: Secondary | ICD-10-CM | POA: Diagnosis present

## 2018-11-26 DIAGNOSIS — M25512 Pain in left shoulder: Secondary | ICD-10-CM

## 2018-12-17 ENCOUNTER — Ambulatory Visit: Payer: Medicare HMO

## 2020-01-15 ENCOUNTER — Other Ambulatory Visit: Payer: Self-pay | Admitting: Family Medicine

## 2020-01-15 DIAGNOSIS — Z13820 Encounter for screening for osteoporosis: Secondary | ICD-10-CM

## 2020-01-15 DIAGNOSIS — Z1231 Encounter for screening mammogram for malignant neoplasm of breast: Secondary | ICD-10-CM

## 2020-02-11 ENCOUNTER — Ambulatory Visit: Payer: Self-pay | Admitting: Podiatry

## 2020-02-18 ENCOUNTER — Other Ambulatory Visit: Payer: Self-pay | Admitting: Podiatry

## 2020-02-18 ENCOUNTER — Ambulatory Visit (INDEPENDENT_AMBULATORY_CARE_PROVIDER_SITE_OTHER): Payer: Medicare Other

## 2020-02-18 ENCOUNTER — Other Ambulatory Visit: Payer: Self-pay

## 2020-02-18 ENCOUNTER — Ambulatory Visit: Payer: Medicare Other | Admitting: Podiatry

## 2020-02-18 DIAGNOSIS — M2031 Hallux varus (acquired), right foot: Secondary | ICD-10-CM

## 2020-02-18 DIAGNOSIS — T8484XA Pain due to internal orthopedic prosthetic devices, implants and grafts, initial encounter: Secondary | ICD-10-CM

## 2020-02-18 DIAGNOSIS — Z01818 Encounter for other preprocedural examination: Secondary | ICD-10-CM | POA: Diagnosis not present

## 2020-02-18 DIAGNOSIS — M19071 Primary osteoarthritis, right ankle and foot: Secondary | ICD-10-CM | POA: Diagnosis not present

## 2020-02-18 DIAGNOSIS — S99921A Unspecified injury of right foot, initial encounter: Secondary | ICD-10-CM

## 2020-02-18 NOTE — Patient Instructions (Signed)
Pre-Operative Instructions  Congratulations, you have decided to take an important step towards improving your quality of life.  You can be assured that the doctors and staff at [facility] will be with you every step of the way.  Here are some important things you should know:  1. Plan to be at the surgery center/hospital at least 1 (one) hour prior to your scheduled time, unless otherwise directed by the surgical center/hospital staff.  You must have a responsible adult accompany you, remain during the surgery and drive you home.  Make sure you have directions to the surgical center/hospital to ensure you arrive on time. 2. If you are having surgery at [facility] or [facility], you will need a copy of your medical history and physical form from your family physician within one month prior to the date of surgery. We will give you a form for your primary physician to complete.  3. We make every effort to accommodate the date you request for surgery.  However, there are times where surgery dates or times have to be moved.  We will contact you as soon as possible if a change in schedule is required.   4. No aspirin/ibuprofen for one week before surgery.  If you are on aspirin, any non-steroidal anti-inflammatory medications (Mobic, Aleve, Ibuprofen) should not be taken seven (7) days prior to your surgery.  You make take Tylenol for pain prior to surgery.  5. Medications - If you are taking daily heart and blood pressure medications, seizure, reflux, allergy, asthma, anxiety, pain or diabetes medications, make sure you notify the surgery center/hospital before the day of surgery so they can tell you which medications you should take or avoid the day of surgery. 6. No food or drink after midnight the night before surgery unless directed otherwise by surgical center/hospital staff. 7. No alcoholic beverages 24-hours prior to surgery.  No smoking 24-hours prior or 24-hours after  surgery. 8. Wear loose pants or shorts. They should be loose enough to fit over bandages, boots, and casts. 9. Don't wear slip-on shoes. Sneakers are preferred. 10. Bring your boot with you to the surgery center/hospital.  Also bring crutches or a walker if your physician has prescribed it for you.  If you do not have this equipment, it will be provided for you after surgery. 11. If you have not been contacted by the surgery center/hospital by the day before your surgery, call to confirm the date and time of your surgery. 12. Leave-time from work may vary depending on the type of surgery you have.  Appropriate arrangements should be made prior to surgery with your employer. 13. Prescriptions will be provided immediately following surgery by your doctor.  Fill these as soon as possible after surgery and take the medication as directed. Pain medications will not be refilled on weekends and must be approved by the doctor. 14. Remove nail polish on the operative foot and avoid getting pedicures prior to surgery. 15. Wash the night before surgery.  The night before surgery wash the foot and leg well with water and the antibacterial soap provided. Be sure to pay special attention to beneath the toenails and in between the toes.  Wash for at least three (3) minutes. Rinse thoroughly with water and dry well with a towel.  Perform this wash unless told not to do so by your physician.  Enclosed: 1 Ice pack (please put in freezer the night before surgery)   1 Hibiclens skin cleaner     Pre-op instructions  If you have any questions regarding the instructions, please do not hesitate to call our office.  [place]: [address], [place], [place] [address] -- [phone]  [place]: [address] [place], [place] [address] -- [phone]  [place]: [address], [place],  [address] -- [phone]   Website: [email or web address] 

## 2020-02-20 ENCOUNTER — Encounter: Payer: Self-pay | Admitting: Podiatry

## 2020-02-20 NOTE — Progress Notes (Signed)
Subjective:  Patient ID: Debra Delacruz, female    DOB: 06-Oct-1950,  MRN: [id number]  Chief Complaint  Patient presents with  . Foot Pain    Patient presents today for painful right hallux which is turning outward and causing pain    69 y.o. female presents with the above complaint.  Patient presents with a complaint of right deviation of the hallux medially consistent with hallux varus.  Patient states is painful with ambulation with and with every step.  Patient has a history of bunionectomy that was done long time ago and since then it progressively slowly got worse and ended up being hallux varus.  Patient states she has tried taping it offloading it made shoe gear modification but none of that has helped.  At this time she would like to discuss surgical option to help correct this as is the becoming very painful to walk on.  She denies seeing anyone else prior to seeing me for this.  She denies any other acute complaints.   Review of Systems: Negative except as noted in the HPI. Denies N/V/F/Ch.  Past Medical History:  Diagnosis Date  . Hypertension     Current Outpatient Medications:  .  omeprazole (PRILOSEC) 40 MG capsule, Take by mouth., Disp: , Rfl:  .  ranitidine (ZANTAC) 150 MG tablet, Take by mouth., Disp: , Rfl:  .  amLODipine (NORVASC) 5 MG tablet, Take 5 mg by mouth daily., Disp: , Rfl:  .  aspirin 81 MG EC tablet, Take by mouth., Disp: , Rfl:  .  carvedilol (COREG) 12.5 MG tablet, Take 12.5 mg by mouth 2 (two) times daily with a meal., Disp: , Rfl:  .  dicyclomine (BENTYL) 20 MG tablet, Take 20 mg by mouth 4 (four) times daily as needed., Disp: , Rfl:  .  HYDROcodone-acetaminophen (NORCO/VICODIN) 5-325 MG tablet, Take 1 tablet by mouth every 4 (four) hours as needed for moderate pain., Disp: 10 tablet, Rfl: 0 .  meloxicam (MOBIC) 15 MG tablet, Take 1 tablet (15 mg total) by mouth daily., Disp: 30 tablet, Rfl: 0 .  methylPREDNISolone (MEDROL DOSEPAK) 4 MG TBPK tablet, Take  Tapered dose as directed, Disp: 21 tablet, Rfl: 0 .  Omega-3 1000 MG CAPS, Take by mouth., Disp: , Rfl:  .  pravastatin (PRAVACHOL) 20 MG tablet, Take 20 mg by mouth daily., Disp: , Rfl:  .  promethazine-dextromethorphan (PROMETHAZINE-DM) 6.25-15 MG/5ML syrup, Take 5 mLs by mouth 4 (four) times daily as needed for cough., Disp: 118 mL, Rfl: 0 .  pseudoephedrine-acetaminophen (TYLENOL SINUS) 30-500 MG TABS tablet, Take 1 tablet by mouth every 4 (four) hours as needed., Disp: , Rfl:   Social History   Tobacco Use  Smoking Status Never Smoker    Allergies  Allergen Reactions  . Penicillins Anaphylaxis  . Sulfa Antibiotics Anaphylaxis  . Sulfasalazine Anaphylaxis   Objective:  There were no vitals filed for this visit. There is no height or weight on file to calculate BMI. Constitutional Well developed. Well nourished.  Vascular Dorsalis pedis pulses palpable bilaterally. Posterior tibial pulses palpable bilaterally. Capillary refill normal to all digits.  No cyanosis or clubbing noted. Pedal hair growth normal.  Neurologic Normal speech. Oriented to person, place, and time. Epicritic sensation to light touch grossly present bilaterally.  Dermatologic Nails well groomed and normal in appearance. No open wounds. No skin lesions.  Orthopedic:  Clinically hallux valgus deformity noted with medial deviation of the hallux on the first metatarsophalangeal joint.  Crepitus noted intra-articular  first MPJ.  Pain with range of motion of the first metatarsophalangeal joint active and passive.  No crepitus or pain with range of motion at the IPJ of the hallux.  It is a very is rigid deformity and not reducible.  Pain on palpation to the [facility] first metatarsophalangeal joint.   Radiographs: 3 views of skeletally mature adult right foot: Osteoarthritic changes noted to the first metatarsophalangeal joint.  No osteophytes or loose bodies noted.  Medial deviation of the hallux noted on the first  metatarsophalangeal joint.  There appears to be changes to the shape of the cartilage of the first metatarsophalangeal joint.  Previous hardware noted.  Does not appear to be loosening or backing out.  No sesamoid bone noted.  No osteoarthritic changes noted of the first metatarsophalangeal joint. Assessment:   1. Arthritis of first metatarsophalangeal (MTP) joint of right foot   2. Acquired hallux varus of right foot   3. Preoperative examination   4. Painful orthopaedic hardware [facility])    Plan:  Patient was evaluated and treated and all questions answered.  Right first metatarsophalangeal joint hallux varus deformity with underlying first metatarsophalangeal joint arthritis -I explained to the patient the etiology of hallux varus deformity likely due to overcorrection of the bunion deformity with sesamoidectomy leading to the hallux varus deformity.  Given that patient also has intra-articular pain with range of motion as well as crepitus clinically palpated I believe patient will get the best correction with MPJ fusion to the right side.  I discussed this with the patient in extensive detail.  Patient states understanding I discussed all the pros and cons of undergoing first metatarsophalangeal joint fusion.  Patient would like to proceed with the fusion.  Also discussed with the patient that I will need to remove the previous K wire as well.  Patient states understanding. -My postop protocol which I explained to the patient extensive detail include nonweightbearing for 3 to 4 weeks followed by weightbearing as tolerated cam boot then transition to regular sneakers.  Patient states understanding -Informed surgical risk consent was reviewed and read aloud to the patient.  I reviewed the films.  I have discussed my findings with the patient in great detail.  I have discussed all risks including but not limited to infection, stiffness, scarring, limp, disability, deformity, damage to blood vessels and  nerves, numbness, poor healing, need for braces, arthritis, chronic pain, amputation, death.  All benefits and realistic expectations discussed in great detail.  I have made no promises as to the outcome.  I have provided realistic expectations.  I have offered the patient a 2nd opinion, which they have declined and assured me they preferred to proceed despite the risks -A total of 47 minutes was spent in direct patient care as well as pre and post patient encounter activities.  This includes documentation as well as reviewing patient chart for labs, imaging, past medical, surgical, social, and family history as documented in the EMR.  I have reviewed medication allergies as documented in EMR.  I discussed the etiology of condition and treatment options from conservative to surgical care.  All risks and benefit of the treatment course was discussed in detail.  All questions were answered and return appointment was discussed.  Since the visit completed in an ambulatory/outpatient setting, the patient and/or parent/guardian has been advised to contact the providers office for worsening condition and seek medical treatment and/or call 911 if the patient deems either is necessary.   No follow-ups on  file.

## 2020-03-04 ENCOUNTER — Telehealth: Payer: Self-pay

## 2020-03-04 NOTE — Telephone Encounter (Signed)
DOS 03/16/2020  HALLUX MPJ FUSION RT - [place] EFFECTIVE DATE - 02/12/2020  PLAN DEDUCTIBLE - $0.00 OUT OF POCKET - $6700.00 W/ $0737.10 REMAINING  CO-INSURANCE 0% / Day OUTPATIENT SURGERY 0% / [place] $395 / Day OUTPATIENT SURGERY $395 / [place]  Notification or Prior Authorization is not required for the requested services  Decision ID #:[id number]

## 2020-03-16 DIAGNOSIS — M13871 Other specified arthritis, right ankle and foot: Secondary | ICD-10-CM

## 2020-03-18 ENCOUNTER — Telehealth: Payer: Self-pay

## 2020-03-18 NOTE — Telephone Encounter (Signed)
Patient called stated that she had surgery on Monday and no pain medication was sent to pharmacy.  Please advise

## 2020-03-19 NOTE — Telephone Encounter (Signed)
It was because there was pharmacy on file so couldn't send it

## 2020-03-19 NOTE — Telephone Encounter (Signed)
I spoke with patient, she stated that she made it through the worse part of the pain and is now taking Ibuprofen and Tylenol which is helping.  She declined any pain medication at this time.  I have updated her pharmacy in chart

## 2020-03-24 ENCOUNTER — Encounter: Payer: Medicare Other | Admitting: Podiatry

## 2020-03-26 ENCOUNTER — Other Ambulatory Visit: Payer: Self-pay

## 2020-03-26 ENCOUNTER — Ambulatory Visit (INDEPENDENT_AMBULATORY_CARE_PROVIDER_SITE_OTHER): Payer: Medicare Other

## 2020-03-26 ENCOUNTER — Ambulatory Visit (INDEPENDENT_AMBULATORY_CARE_PROVIDER_SITE_OTHER): Payer: Medicare Other | Admitting: Podiatry

## 2020-03-26 DIAGNOSIS — M79671 Pain in right foot: Secondary | ICD-10-CM

## 2020-03-26 DIAGNOSIS — B353 Tinea pedis: Secondary | ICD-10-CM

## 2020-03-26 DIAGNOSIS — T8484XA Pain due to internal orthopedic prosthetic devices, implants and grafts, initial encounter: Secondary | ICD-10-CM

## 2020-03-26 DIAGNOSIS — Z9889 Other specified postprocedural states: Secondary | ICD-10-CM

## 2020-03-26 MED ORDER — CLOTRIMAZOLE-BETAMETHASONE 1-0.05 % EX CREA: 1.0000 "application " | TOPICAL_CREAM | Freq: Two times a day (BID) | CUTANEOUS | 0 refills | Status: AC

## 2020-03-27 ENCOUNTER — Encounter: Payer: Self-pay | Admitting: Podiatry

## 2020-03-27 ENCOUNTER — Telehealth: Payer: Self-pay | Admitting: Podiatry

## 2020-03-27 NOTE — Progress Notes (Signed)
Subjective:  Patient ID: Debra Delacruz, female    DOB: 07/06/1950,  MRN: [id number]  Chief Complaint  Patient presents with  . Routine Post Op    POV #1 DOS 03/16/20 HALLUX MPJ FUSION RT FOOT     69 y.o. female returns for post-op check.  Patient is doing well.  She has mild discomfort overall much better.  She has been using crutches has been nonweightbearing to the right foot.  She denies any other acute complaints.  She has a small rash to the foot for which she would like to know if there is something that can be prescribed for it.  She states it popped out of nowhere.  She is not allergic to anything she has not taken any Percocet for pain medication.  She has not applied anything to the incision site.  Review of Systems: Negative except as noted in the HPI. Denies N/V/F/Ch.  Past Medical History:  Diagnosis Date  . Hypertension     Current Outpatient Medications:  .  amLODipine (NORVASC) 5 MG tablet, Take 5 mg by mouth daily., Disp: , Rfl:  .  aspirin 81 MG EC tablet, Take by mouth., Disp: , Rfl:  .  carvedilol (COREG) 12.5 MG tablet, Take 12.5 mg by mouth 2 (two) times daily with a meal., Disp: , Rfl:  .  clotrimazole-betamethasone (LOTRISONE) cream, Apply 1 application topically 2 (two) times daily., Disp: 30 g, Rfl: 0 .  dicyclomine (BENTYL) 20 MG tablet, Take 20 mg by mouth 4 (four) times daily as needed., Disp: , Rfl:  .  HYDROcodone-acetaminophen (NORCO/VICODIN) 5-325 MG tablet, Take 1 tablet by mouth every 4 (four) hours as needed for moderate pain., Disp: 10 tablet, Rfl: 0 .  meloxicam (MOBIC) 15 MG tablet, Take 1 tablet (15 mg total) by mouth daily., Disp: 30 tablet, Rfl: 0 .  methylPREDNISolone (MEDROL DOSEPAK) 4 MG TBPK tablet, Take Tapered dose as directed, Disp: 21 tablet, Rfl: 0 .  Omega-3 1000 MG CAPS, Take by mouth., Disp: , Rfl:  .  omeprazole (PRILOSEC) 40 MG capsule, Take by mouth., Disp: , Rfl:  .  pravastatin (PRAVACHOL) 20 MG tablet, Take 20 mg by mouth  daily., Disp: , Rfl:  .  promethazine-dextromethorphan (PROMETHAZINE-DM) 6.25-15 MG/5ML syrup, Take 5 mLs by mouth 4 (four) times daily as needed for cough., Disp: 118 mL, Rfl: 0 .  pseudoephedrine-acetaminophen (TYLENOL SINUS) 30-500 MG TABS tablet, Take 1 tablet by mouth every 4 (four) hours as needed., Disp: , Rfl:  .  ranitidine (ZANTAC) 150 MG tablet, Take by mouth., Disp: , Rfl:   Social History   Tobacco Use  Smoking Status Never Smoker    Allergies  Allergen Reactions  . Penicillins Anaphylaxis  . Sulfa Antibiotics Anaphylaxis  . Sulfasalazine Anaphylaxis   Objective:  There were no vitals filed for this visit. There is no height or weight on file to calculate BMI. Constitutional Well developed. Well nourished.  Vascular Foot warm and well perfused. Capillary refill normal to all digits.   Neurologic Normal speech. Oriented to person, place, and time. Epicritic sensation to light touch grossly present bilaterally.  Dermatologic Skin healing well without signs of infection. Skin edges well coapted without signs of infection.  Orthopedic: Tenderness to palpation noted about the surgical site.  Mild athlete's foot with subjective itching and patches of scaling/epidermal lysis noted   Radiographs: 3 views of skeletally mature the right foot: Hardware is intact good correction alignment noted.  Good bony bridging noted across the  fusion site. Assessment:   1. Painful orthopaedic hardware ([place])   2. S/P foot surgery, right   3. Athlete's foot on left    Plan:  Patient was evaluated and treated and all questions answered.  S/p foot surgery right -Progressing as expected post-operatively. -XR: See above -WB Status: Nonweightbearing with crutches in cam boot -Sutures: Intact.  No signs of dehiscence or complication noted -Medications: None -Foot redressed.  Left athlete's foot mild -I explained patient the etiology of athlete's foot and various treatment options were  discussed.  I believe patient will benefit from Lotrisone cream.  Lotrisone cream was sent to the pharmacy.  No follow-ups on file.

## 2020-03-27 NOTE — Telephone Encounter (Signed)
Patient wants to know when laying or resting could she remove the boot

## 2020-03-30 NOTE — Telephone Encounter (Signed)
Yeah she can

## 2020-03-31 ENCOUNTER — Encounter: Payer: Medicare Other | Admitting: Podiatry

## 2020-04-07 ENCOUNTER — Other Ambulatory Visit: Payer: Self-pay

## 2020-04-07 ENCOUNTER — Ambulatory Visit (INDEPENDENT_AMBULATORY_CARE_PROVIDER_SITE_OTHER): Payer: Medicare Other | Admitting: Podiatry

## 2020-04-07 DIAGNOSIS — T8484XA Pain due to internal orthopedic prosthetic devices, implants and grafts, initial encounter: Secondary | ICD-10-CM

## 2020-04-07 DIAGNOSIS — Z9889 Other specified postprocedural states: Secondary | ICD-10-CM

## 2020-04-07 DIAGNOSIS — S90821A Blister (nonthermal), right foot, initial encounter: Secondary | ICD-10-CM

## 2020-04-08 ENCOUNTER — Encounter: Payer: Self-pay | Admitting: Podiatry

## 2020-04-08 MED ORDER — DOXYCYCLINE HYCLATE 100 MG PO TABS: 100.0000 mg | ORAL_TABLET | Freq: Two times a day (BID) | ORAL | 0 refills | Status: DC

## 2020-04-08 NOTE — Progress Notes (Signed)
Subjective:  Patient ID: Debra Delacruz, female    DOB: [birth year],  MRN: [id number]  Chief Complaint  Patient presents with  . Routine Post Op    POV #2 DOS 03/16/20 HALLUX MPJ FUSION RT FOOT     69 y.o. female returns for post-op check.  Patient is doing well. She states that she has developed some for frictional blister from tight bandaging. She has been nonweightbearing. She denies any other acute complaints. She would like to have the stitches are coming out today. Overall she has been doing fine. Her pain is well controlled. Review of Systems: Negative except as noted in the HPI. Denies N/V/F/Ch.  Past Medical History:  Diagnosis Date  . Hypertension     Current Outpatient Medications:  .  amLODipine (NORVASC) 5 MG tablet, Take 5 mg by mouth daily., Disp: , Rfl:  .  aspirin 81 MG EC tablet, Take by mouth., Disp: , Rfl:  .  carvedilol (COREG) 12.5 MG tablet, Take 12.5 mg by mouth 2 (two) times daily with a meal., Disp: , Rfl:  .  clotrimazole-betamethasone (LOTRISONE) cream, Apply 1 application topically 2 (two) times daily., Disp: 30 g, Rfl: 0 .  dicyclomine (BENTYL) 20 MG tablet, Take 20 mg by mouth 4 (four) times daily as needed., Disp: , Rfl:  .  doxycycline (VIBRA-TABS) 100 MG tablet, Take 1 tablet (100 mg total) by mouth 2 (two) times daily., Disp: 20 tablet, Rfl: 0 .  HYDROcodone-acetaminophen (NORCO/VICODIN) 5-325 MG tablet, Take 1 tablet by mouth every 4 (four) hours as needed for moderate pain., Disp: 10 tablet, Rfl: 0 .  meloxicam (MOBIC) 15 MG tablet, Take 1 tablet (15 mg total) by mouth daily., Disp: 30 tablet, Rfl: 0 .  methylPREDNISolone (MEDROL DOSEPAK) 4 MG TBPK tablet, Take Tapered dose as directed, Disp: 21 tablet, Rfl: 0 .  Omega-3 1000 MG CAPS, Take by mouth., Disp: , Rfl:  .  omeprazole (PRILOSEC) 40 MG capsule, Take by mouth., Disp: , Rfl:  .  pravastatin (PRAVACHOL) 20 MG tablet, Take 20 mg by mouth daily., Disp: , Rfl:  .  promethazine-dextromethorphan  (PROMETHAZINE-DM) 6.25-15 MG/5ML syrup, Take 5 mLs by mouth 4 (four) times daily as needed for cough., Disp: 118 mL, Rfl: 0 .  pseudoephedrine-acetaminophen (TYLENOL SINUS) 30-500 MG TABS tablet, Take 1 tablet by mouth every 4 (four) hours as needed., Disp: , Rfl:  .  ranitidine (ZANTAC) 150 MG tablet, Take by mouth., Disp: , Rfl:   Social History   Tobacco Use  Smoking Status Never Smoker    Allergies  Allergen Reactions  . Penicillins Anaphylaxis  . Sulfa Antibiotics Anaphylaxis  . Sulfasalazine Anaphylaxis   Objective:  There were no vitals filed for this visit. There is no height or weight on file to calculate BMI. Constitutional Well developed. Well nourished.  Vascular Foot warm and well perfused. Capillary refill normal to all digits.   Neurologic Normal speech. Oriented to person, place, and time. Epicritic sensation to light touch grossly present bilaterally.  Dermatologic  the skin incision has completely epithelialized. However the skin edges there is frictional blister that has been deroofed. Superficial epidermal lysis noted. No exposure of hardware noted.  Orthopedic: Tenderness to palpation noted about the surgical site.  Mild athlete's foot with subjective itching and patches of scaling/epidermal lysis noted   Radiographs: 3 views of skeletally mature the right foot: Hardware is intact good correction alignment noted.  Good bony bridging noted across the fusion site. Assessment:   1. Friction  blister of the foot, right, initial encounter   2. S/P foot surgery, right   3. Painful orthopaedic hardware [facility])    Plan:  Patient was evaluated and treated and all questions answered.  S/p foot surgery right -Progressing as expected post-operatively. -XR: See above -WB Status: Nonweightbearing with crutches in cam boot -Sutures: Intact.  No signs of dehiscence or complication noted -Medications: None -Foot redressed.  Frictional blister right -I explained to the  patient the etiology of friction blister like to do with tight bandaging. At this time I discussed with her the importance of loose bandaging and preventing friction between the bandage and the skin. The skin was completely debrided down. Superficial wound noted however does not appear to be deep or infected. However I will place her on prophylaxis antibiotics.  Left athlete's foot mild -I explained patient the etiology of athlete's foot and various treatment options were discussed.  I believe patient will benefit from Lotrisone cream.  Lotrisone cream was sent to the pharmacy.  No follow-ups on file.

## 2020-04-14 ENCOUNTER — Encounter: Payer: Medicare Other | Admitting: Podiatry

## 2020-04-21 ENCOUNTER — Ambulatory Visit (INDEPENDENT_AMBULATORY_CARE_PROVIDER_SITE_OTHER): Payer: Medicare Other

## 2020-04-21 ENCOUNTER — Ambulatory Visit (INDEPENDENT_AMBULATORY_CARE_PROVIDER_SITE_OTHER): Payer: Medicare Other | Admitting: Podiatry

## 2020-04-21 ENCOUNTER — Encounter: Payer: Self-pay | Admitting: Podiatry

## 2020-04-21 ENCOUNTER — Other Ambulatory Visit: Payer: Self-pay

## 2020-04-21 DIAGNOSIS — Z9889 Other specified postprocedural states: Secondary | ICD-10-CM

## 2020-04-21 DIAGNOSIS — T8484XA Pain due to internal orthopedic prosthetic devices, implants and grafts, initial encounter: Secondary | ICD-10-CM | POA: Diagnosis not present

## 2020-04-21 DIAGNOSIS — S90821A Blister (nonthermal), right foot, initial encounter: Secondary | ICD-10-CM | POA: Diagnosis not present

## 2020-04-21 NOTE — Progress Notes (Signed)
Subjective:  Patient ID: Debra Delacruz, female    DOB: July 17, 1950,  MRN: [id number]  Chief Complaint  Patient presents with  . Routine Post Op    POV #3 DOS 03/16/20 HALLUX MPJ FUSION RT FOOT     69 y.o. female returns for post-op check.  Patient is doing well.  The frictional blister site is doing well.  They have been doing Betadine wet-to-dry dressing changes.  She has been partial weightbearing to the heel.  She denies any other acute complaints.  She would like to start getting the foot wet. Review of Systems: Negative except as noted in the HPI. Denies N/V/F/Ch.  Past Medical History:  Diagnosis Date  . Hypertension     Current Outpatient Medications:  .  amLODipine (NORVASC) 5 MG tablet, Take 5 mg by mouth daily., Disp: , Rfl:  .  aspirin 81 MG EC tablet, Take by mouth., Disp: , Rfl:  .  carvedilol (COREG) 12.5 MG tablet, Take 12.5 mg by mouth 2 (two) times daily with a meal., Disp: , Rfl:  .  clotrimazole-betamethasone (LOTRISONE) cream, Apply 1 application topically 2 (two) times daily., Disp: 30 g, Rfl: 0 .  dicyclomine (BENTYL) 20 MG tablet, Take 20 mg by mouth 4 (four) times daily as needed., Disp: , Rfl:  .  doxycycline (VIBRA-TABS) 100 MG tablet, Take 1 tablet (100 mg total) by mouth 2 (two) times daily., Disp: 20 tablet, Rfl: 0 .  HYDROcodone-acetaminophen (NORCO/VICODIN) 5-325 MG tablet, Take 1 tablet by mouth every 4 (four) hours as needed for moderate pain., Disp: 10 tablet, Rfl: 0 .  meloxicam (MOBIC) 15 MG tablet, Take 1 tablet (15 mg total) by mouth daily., Disp: 30 tablet, Rfl: 0 .  methylPREDNISolone (MEDROL DOSEPAK) 4 MG TBPK tablet, Take Tapered dose as directed, Disp: 21 tablet, Rfl: 0 .  Omega-3 1000 MG CAPS, Take by mouth., Disp: , Rfl:  .  omeprazole (PRILOSEC) 40 MG capsule, Take by mouth., Disp: , Rfl:  .  pravastatin (PRAVACHOL) 20 MG tablet, Take 20 mg by mouth daily., Disp: , Rfl:  .  promethazine-dextromethorphan (PROMETHAZINE-DM) 6.25-15 MG/5ML  syrup, Take 5 mLs by mouth 4 (four) times daily as needed for cough., Disp: 118 mL, Rfl: 0 .  pseudoephedrine-acetaminophen (TYLENOL SINUS) 30-500 MG TABS tablet, Take 1 tablet by mouth every 4 (four) hours as needed., Disp: , Rfl:  .  ranitidine (ZANTAC) 150 MG tablet, Take by mouth., Disp: , Rfl:   Social History   Tobacco Use  Smoking Status Never Smoker    Allergies  Allergen Reactions  . Penicillins Anaphylaxis  . Sulfa Antibiotics Anaphylaxis  . Sulfasalazine Anaphylaxis   Objective:  There were no vitals filed for this visit. There is no height or weight on file to calculate BMI. Constitutional Well developed. Well nourished.  Vascular Foot warm and well perfused. Capillary refill normal to all digits.   Neurologic Normal speech. Oriented to person, place, and time. Epicritic sensation to light touch grossly present bilaterally.  Dermatologic  the skin incision has completely epithelialized. However the skin edges there is frictional blister that has been deroofed. Superficial epidermal lysis noted. No exposure of hardware noted.  Orthopedic:  Now tenderness to palpation noted about the surgical site.  Mild athlete's foot with subjective itching and patches of scaling/epidermal lysis noted   Radiographs: 3 views of skeletally mature the right foot: Hardware is intact good correction alignment noted.  Good bony bridging noted across the fusion site. Assessment:   1. S/P  foot surgery, right   2. Painful orthopaedic hardware [facility])    Plan:  Patient was evaluated and treated and all questions answered.  S/p foot surgery right -Progressing as expected post-operatively. -XR: See above -WB Status: Weightbearing as tolerated in cam boot -Sutures: Removed.  No dehiscence noted no complication noted. -Medications: None -Foot redressed.  Frictional blister right -Healing well.  No further blister ration noted.  Good skin has reepithelialized.  Left athlete's foot mild -I  explained patient the etiology of athlete's foot and various treatment options were discussed.  I believe patient will benefit from Lotrisone cream.  Lotrisone cream was sent to the pharmacy.  No follow-ups on file.

## 2020-05-12 ENCOUNTER — Encounter: Payer: Medicare Other | Admitting: Podiatry

## 2020-05-14 ENCOUNTER — Other Ambulatory Visit: Payer: Self-pay

## 2020-05-14 ENCOUNTER — Ambulatory Visit (INDEPENDENT_AMBULATORY_CARE_PROVIDER_SITE_OTHER): Payer: Medicare Other | Admitting: Podiatry

## 2020-05-14 DIAGNOSIS — T8484XA Pain due to internal orthopedic prosthetic devices, implants and grafts, initial encounter: Secondary | ICD-10-CM

## 2020-05-14 DIAGNOSIS — S90821A Blister (nonthermal), right foot, initial encounter: Secondary | ICD-10-CM

## 2020-05-14 DIAGNOSIS — Z9889 Other specified postprocedural states: Secondary | ICD-10-CM

## 2020-05-16 ENCOUNTER — Encounter: Payer: Self-pay | Admitting: Podiatry

## 2020-05-16 NOTE — Progress Notes (Signed)
Subjective:  Patient ID: Debra Delacruz, female    DOB: 01-28-1951,  MRN: [id number]  Chief Complaint  Patient presents with   Routine Post Op    DOS 03/16/20 HALLUX MPJ FUSION RT FOOT  "its feelling alot better"     69 y.o. female returns for post-op check.  Patient is doing well.  She is doing well.  She has been nonweightbearing with a cam boot on.  She is very transition.  She denies any other acute complaints. Review of Systems: Negative except as noted in the HPI. Denies N/V/F/Ch.  Past Medical History:  Diagnosis Date   Hypertension     Current Outpatient Medications:    amLODipine (NORVASC) 5 MG tablet, Take 5 mg by mouth daily., Disp: , Rfl:    aspirin 81 MG EC tablet, Take by mouth., Disp: , Rfl:    carvedilol (COREG) 12.5 MG tablet, Take 12.5 mg by mouth 2 (two) times daily with a meal., Disp: , Rfl:    clotrimazole-betamethasone (LOTRISONE) cream, Apply 1 application topically 2 (two) times daily., Disp: 30 g, Rfl: 0   dicyclomine (BENTYL) 20 MG tablet, Take 20 mg by mouth 4 (four) times daily as needed., Disp: , Rfl:    doxycycline (VIBRA-TABS) 100 MG tablet, Take 1 tablet (100 mg total) by mouth 2 (two) times daily., Disp: 20 tablet, Rfl: 0   HYDROcodone-acetaminophen (NORCO/VICODIN) 5-325 MG tablet, Take 1 tablet by mouth every 4 (four) hours as needed for moderate pain., Disp: 10 tablet, Rfl: 0   meloxicam (MOBIC) 15 MG tablet, Take 1 tablet (15 mg total) by mouth daily., Disp: 30 tablet, Rfl: 0   methylPREDNISolone (MEDROL DOSEPAK) 4 MG TBPK tablet, Take Tapered dose as directed, Disp: 21 tablet, Rfl: 0   Omega-3 1000 MG CAPS, Take by mouth., Disp: , Rfl:    omeprazole (PRILOSEC) 40 MG capsule, Take by mouth., Disp: , Rfl:    pravastatin (PRAVACHOL) 20 MG tablet, Take 20 mg by mouth daily., Disp: , Rfl:    promethazine-dextromethorphan (PROMETHAZINE-DM) 6.25-15 MG/5ML syrup, Take 5 mLs by mouth 4 (four) times daily as needed for cough., Disp: 118 mL, Rfl:  0   pseudoephedrine-acetaminophen (TYLENOL SINUS) 30-500 MG TABS tablet, Take 1 tablet by mouth every 4 (four) hours as needed., Disp: , Rfl:    ranitidine (ZANTAC) 150 MG tablet, Take by mouth., Disp: , Rfl:   Social History   Tobacco Use  Smoking Status Never Smoker    Allergies  Allergen Reactions   Penicillins Anaphylaxis   Sulfa Antibiotics Anaphylaxis   Sulfasalazine Anaphylaxis   Objective:  There were no vitals filed for this visit. There is no height or weight on file to calculate BMI. Constitutional Well developed. Well nourished.  Vascular Foot warm and well perfused. Capillary refill normal to all digits.   Neurologic Normal speech. Oriented to person, place, and time. Epicritic sensation to light touch grossly present bilaterally.  Dermatologic  the skin incision has completely epithelialized. However the skin edges there is frictional blister that has been deroofed. Superficial epidermal lysis noted. No exposure of hardware noted.  Orthopedic:  Now tenderness to palpation noted about the surgical site.  Mild athlete's foot with subjective itching and patches of scaling/epidermal lysis noted   Radiographs: 3 views of skeletally mature the right foot: Hardware is intact good correction alignment noted.  Good bony bridging noted across the fusion site. Assessment:   1. S/P foot surgery, right   2. Painful orthopaedic hardware ([place])   3.  Friction blister of the foot, right, initial encounter    Plan:  Patient was evaluated and treated and all questions answered.  S/p foot surgery right -Progressing as expected post-operatively. -XR: See above -WB Status: Transition to regular shoes. -Sutures: Removed.  No dehiscence noted no complication noted. -Medications: None -Foot redressed.  Frictional blister right -Healing well.  No further blister ration noted.  Good skin has reepithelialized.  Left athlete's foot mild -I explained patient the etiology of  athlete's foot and various treatment options were discussed.  I believe patient will benefit from Lotrisone cream.  Lotrisone cream was sent to the pharmacy.  No follow-ups on file.

## 2020-06-23 ENCOUNTER — Ambulatory Visit (INDEPENDENT_AMBULATORY_CARE_PROVIDER_SITE_OTHER): Payer: Medicare Other | Admitting: Podiatry

## 2020-06-23 ENCOUNTER — Ambulatory Visit (INDEPENDENT_AMBULATORY_CARE_PROVIDER_SITE_OTHER): Payer: Medicare Other

## 2020-06-23 ENCOUNTER — Encounter: Payer: Self-pay | Admitting: Podiatry

## 2020-06-23 ENCOUNTER — Other Ambulatory Visit: Payer: Self-pay

## 2020-06-23 DIAGNOSIS — Z9889 Other specified postprocedural states: Secondary | ICD-10-CM | POA: Diagnosis not present

## 2020-06-23 DIAGNOSIS — M2031 Hallux varus (acquired), right foot: Secondary | ICD-10-CM

## 2020-06-23 NOTE — Progress Notes (Signed)
Subjective:  Patient ID: Debra Delacruz, female    DOB: [birth year],  MRN: [id number]  Chief Complaint  Patient presents with  . Routine Post Op    DOS 03/16/20 HALLUX MPJ FUSION RT FOOT      70 y.o. female returns for post-op check.  Patient is doing well.  She is doing well.  She has been weightbearing as tolerated in regular sneakers. She denies any other acute complaints. Review of Systems: Negative except as noted in the HPI. Denies N/V/F/Ch.  Past Medical History:  Diagnosis Date  . Hypertension     Current Outpatient Medications:  .  amLODipine (NORVASC) 5 MG tablet, Take 5 mg by mouth daily., Disp: , Rfl:  .  aspirin 81 MG EC tablet, Take by mouth., Disp: , Rfl:  .  carvedilol (COREG) 12.5 MG tablet, Take 12.5 mg by mouth 2 (two) times daily with a meal., Disp: , Rfl:  .  clotrimazole-betamethasone (LOTRISONE) cream, Apply 1 application topically 2 (two) times daily., Disp: 30 g, Rfl: 0 .  dicyclomine (BENTYL) 20 MG tablet, Take 20 mg by mouth 4 (four) times daily as needed., Disp: , Rfl:  .  doxycycline (VIBRA-TABS) 100 MG tablet, Take 1 tablet (100 mg total) by mouth 2 (two) times daily., Disp: 20 tablet, Rfl: 0 .  HYDROcodone-acetaminophen (NORCO/VICODIN) 5-325 MG tablet, Take 1 tablet by mouth every 4 (four) hours as needed for moderate pain., Disp: 10 tablet, Rfl: 0 .  meloxicam (MOBIC) 15 MG tablet, Take 1 tablet (15 mg total) by mouth daily., Disp: 30 tablet, Rfl: 0 .  methylPREDNISolone (MEDROL DOSEPAK) 4 MG TBPK tablet, Take Tapered dose as directed, Disp: 21 tablet, Rfl: 0 .  Omega-3 1000 MG CAPS, Take by mouth., Disp: , Rfl:  .  omeprazole (PRILOSEC) 40 MG capsule, Take by mouth., Disp: , Rfl:  .  pravastatin (PRAVACHOL) 20 MG tablet, Take 20 mg by mouth daily., Disp: , Rfl:  .  promethazine-dextromethorphan (PROMETHAZINE-DM) 6.25-15 MG/5ML syrup, Take 5 mLs by mouth 4 (four) times daily as needed for cough., Disp: 118 mL, Rfl: 0 .  pseudoephedrine-acetaminophen  (TYLENOL SINUS) 30-500 MG TABS tablet, Take 1 tablet by mouth every 4 (four) hours as needed., Disp: , Rfl:  .  ranitidine (ZANTAC) 150 MG tablet, Take by mouth., Disp: , Rfl:   Social History   Tobacco Use  Smoking Status Never Smoker  Smokeless Tobacco Not on file    Allergies  Allergen Reactions  . Penicillins Anaphylaxis  . Sulfa Antibiotics Anaphylaxis  . Sulfasalazine Anaphylaxis   Objective:  There were no vitals filed for this visit. There is no height or weight on file to calculate BMI. Constitutional Well developed. Well nourished.  Vascular Foot warm and well perfused. Capillary refill normal to all digits.   Neurologic Normal speech. Oriented to person, place, and time. Epicritic sensation to light touch grossly present bilaterally.  Dermatologic  the skin incision has completely epithelialized.  Skin completely epithelialized.  Good correction alignment noted.  Stiffness noted at the first MPJ right no exposure of hardware noted.  Orthopedic:  No tenderness to palpation noted about the surgical site.  Mild athlete's foot with subjective itching and patches of scaling/epidermal lysis noted   Radiographs: 3 views of skeletally mature the right foot: Hardware is intact good correction alignment noted.  Good bony bridging noted across the fusion site. Assessment:   1. S/P foot surgery, right   2. Acquired hallux varus of right foot    Plan:  Patient was evaluated and treated and all questions answered.  S/p foot surgery right -Progressing as expected post-operatively. -XR: See above -WB Status: Transition to regular shoes. -Sutures: Removed.  No dehiscence noted no complication noted. -Medications: None -Clinically and radiographically patient has attempted adequate fusion.  At this time I discussed with her she can return to regular activities without any restriction.  Patient states understanding if any foot and ankle issues arise in the future she will come  back and see me.  Frictional blister right -Healing well.  No further blister ration noted.  Good skin has reepithelialized.  Left athlete's foot mild -I explained patient the etiology of athlete's foot and various treatment options were discussed.  I believe patient will benefit from Lotrisone cream.  Lotrisone cream was sent to the pharmacy.  No follow-ups on file.

## 2020-06-25 ENCOUNTER — Encounter: Payer: Medicare Other | Admitting: Podiatry

## 2020-12-04 ENCOUNTER — Other Ambulatory Visit: Payer: Self-pay | Admitting: Family Medicine

## 2020-12-04 DIAGNOSIS — Z1231 Encounter for screening mammogram for malignant neoplasm of breast: Secondary | ICD-10-CM

## 2020-12-24 ENCOUNTER — Ambulatory Visit
Admission: RE | Admit: 2020-12-24 | Discharge: 2020-12-24 | Disposition: A | Discharge status: Home / Self Care | Payer: Medicare Other | Source: Ambulatory Visit | Attending: Family Medicine | Admitting: Family Medicine

## 2020-12-24 ENCOUNTER — Other Ambulatory Visit: Payer: Self-pay

## 2020-12-24 DIAGNOSIS — Z1231 Encounter for screening mammogram for malignant neoplasm of breast: Secondary | ICD-10-CM | POA: Diagnosis not present

## 2020-12-31 ENCOUNTER — Telehealth (INDEPENDENT_AMBULATORY_CARE_PROVIDER_SITE_OTHER): Payer: Self-pay | Admitting: Gastroenterology

## 2020-12-31 DIAGNOSIS — Z1211 Encounter for screening for malignant neoplasm of colon: Secondary | ICD-10-CM

## 2020-12-31 MED ORDER — PEG 3350-KCL-NA BICARB-NACL 420 G PO SOLR: 4000.0000 mL | Freq: Once | ORAL | 0 refills | Status: AC

## 2020-12-31 NOTE — Progress Notes (Signed)
Gastroenterology Pre-Procedure Review  Request Date: 01/15/21 Requesting Physician: Dr. Bonna Gains  PATIENT REVIEW QUESTIONS: The patient responded to the following health history questions as indicated:    1. Are you having any GI issues? no 2. Do you have a personal history of Polyps? no 3. Do you have a family history of Colon Cancer or Polyps? yes (Aunt colon polyps) 4. Diabetes Mellitus? no 5. Joint replacements in the past 12 months?no 6. Major health problems in the past 3 months?no 7. Any artificial heart valves, MVP, or defibrillator?no    MEDICATIONS & ALLERGIES:    Patient reports the following regarding taking any anticoagulation/antiplatelet therapy:   Plavix, Coumadin, Eliquis, Xarelto, Lovenox, Pradaxa, Brilinta, or Effient? no Aspirin? yes (81 mg)  Patient confirms/reports the following medications:  Current Outpatient Medications  Medication Sig Dispense Refill   amLODipine (NORVASC) 5 MG tablet Take 5 mg by mouth daily.     aspirin 81 MG EC tablet Take by mouth.     carvedilol (COREG) 12.5 MG tablet Take 12.5 mg by mouth 2 (two) times daily with a meal.     clotrimazole-betamethasone (LOTRISONE) cream Apply 1 application topically 2 (two) times daily. 30 g 0   dicyclomine (BENTYL) 20 MG tablet Take 20 mg by mouth 4 (four) times daily as needed.     doxycycline (VIBRA-TABS) 100 MG tablet Take 1 tablet (100 mg total) by mouth 2 (two) times daily. 20 tablet 0   HYDROcodone-acetaminophen (NORCO/VICODIN) 5-325 MG tablet Take 1 tablet by mouth every 4 (four) hours as needed for moderate pain. 10 tablet 0   meloxicam (MOBIC) 15 MG tablet Take 1 tablet (15 mg total) by mouth daily. 30 tablet 0   methylPREDNISolone (MEDROL DOSEPAK) 4 MG TBPK tablet Take Tapered dose as directed 21 tablet 0   Omega-3 1000 MG CAPS Take by mouth.     omeprazole (PRILOSEC) 40 MG capsule Take by mouth.     pravastatin (PRAVACHOL) 20 MG tablet Take 20 mg by mouth daily.      promethazine-dextromethorphan (PROMETHAZINE-DM) 6.25-15 MG/5ML syrup Take 5 mLs by mouth 4 (four) times daily as needed for cough. 118 mL 0   pseudoephedrine-acetaminophen (TYLENOL SINUS) 30-500 MG TABS tablet Take 1 tablet by mouth every 4 (four) hours as needed.     ranitidine (ZANTAC) 150 MG tablet Take by mouth.     No current facility-administered medications for this visit.    Patient confirms/reports the following allergies:  Allergies  Allergen Reactions   Penicillins Anaphylaxis   Sulfa Antibiotics Anaphylaxis   Sulfasalazine Anaphylaxis    No orders of the defined types were placed in this encounter.   AUTHORIZATION INFORMATION Primary Insurance: 1D#: Group #:  Secondary Insurance: 1D#: Group #:  SCHEDULE INFORMATION: Date: 01/15/21 Time: Location: [place]

## 2021-01-15 ENCOUNTER — Ambulatory Visit: Payer: Medicare Other | Admitting: Certified Registered"

## 2021-01-15 ENCOUNTER — Encounter
Admission: RE | Disposition: A | Discharge status: Home / Self Care | Payer: Self-pay | Source: Home / Self Care | Attending: Gastroenterology

## 2021-01-15 ENCOUNTER — Encounter: Payer: Self-pay | Admitting: Gastroenterology

## 2021-01-15 ENCOUNTER — Ambulatory Visit
Admission: RE | Admit: 2021-01-15 | Discharge: 2021-01-15 | Disposition: A | Discharge status: Home / Self Care | Payer: Medicare Other | Attending: Gastroenterology | Admitting: Gastroenterology

## 2021-01-15 DIAGNOSIS — Z882 Allergy status to sulfonamides status: Secondary | ICD-10-CM | POA: Insufficient documentation

## 2021-01-15 DIAGNOSIS — D124 Benign neoplasm of descending colon: Secondary | ICD-10-CM | POA: Diagnosis not present

## 2021-01-15 DIAGNOSIS — D120 Benign neoplasm of cecum: Secondary | ICD-10-CM | POA: Diagnosis not present

## 2021-01-15 DIAGNOSIS — K5730 Diverticulosis of large intestine without perforation or abscess without bleeding: Secondary | ICD-10-CM | POA: Diagnosis not present

## 2021-01-15 DIAGNOSIS — Z7982 Long term (current) use of aspirin: Secondary | ICD-10-CM | POA: Insufficient documentation

## 2021-01-15 DIAGNOSIS — Z880 Allergy status to penicillin: Secondary | ICD-10-CM | POA: Insufficient documentation

## 2021-01-15 DIAGNOSIS — Z79899 Other long term (current) drug therapy: Secondary | ICD-10-CM | POA: Diagnosis not present

## 2021-01-15 DIAGNOSIS — D125 Benign neoplasm of sigmoid colon: Secondary | ICD-10-CM | POA: Insufficient documentation

## 2021-01-15 DIAGNOSIS — K635 Polyp of colon: Secondary | ICD-10-CM

## 2021-01-15 DIAGNOSIS — Z1211 Encounter for screening for malignant neoplasm of colon: Secondary | ICD-10-CM | POA: Diagnosis not present

## 2021-01-15 HISTORY — PX: COLONOSCOPY WITH PROPOFOL: SHX5780

## 2021-01-15 HISTORY — DX: Pure hypercholesterolemia, unspecified: E78.00

## 2021-01-15 SURGERY — COLONOSCOPY WITH PROPOFOL
Anesthesia: General

## 2021-01-15 MED ORDER — PROPOFOL 500 MG/50ML IV EMUL
INTRAVENOUS | Status: DC | PRN
  Administered 2021-01-15: 130 ug/kg/min via INTRAVENOUS

## 2021-01-15 MED ORDER — SODIUM CHLORIDE 0.9 % IV SOLN
INTRAVENOUS | Status: DC
  Administered 2021-01-15: 1000 mL via INTRAVENOUS

## 2021-01-15 MED ORDER — PROPOFOL 10 MG/ML IV BOLUS
INTRAVENOUS | Status: DC | PRN
  Administered 2021-01-15: 70 mg via INTRAVENOUS

## 2021-01-15 MED ORDER — ONDANSETRON HCL 4 MG/2ML IJ SOLN
INTRAMUSCULAR | Status: DC | PRN
  Administered 2021-01-15: 4 mg via INTRAVENOUS

## 2021-01-15 MED ORDER — GLYCOPYRROLATE 0.2 MG/ML IJ SOLN
INTRAMUSCULAR | Status: DC | PRN
  Administered 2021-01-15: .2 mg via INTRAVENOUS

## 2021-01-15 MED ORDER — PROPOFOL 10 MG/ML IV BOLUS
INTRAVENOUS | Status: AC
  Filled 2021-01-15: qty 40

## 2021-01-15 MED ORDER — PROPOFOL 500 MG/50ML IV EMUL
INTRAVENOUS | Status: AC
  Filled 2021-01-15: qty 50

## 2021-01-15 NOTE — Transfer of Care (Signed)
Immediate Anesthesia Transfer of Care Note  Patient: Debra Delacruz  Procedure(s) Performed: COLONOSCOPY WITH PROPOFOL  Patient Location: PACU and Endoscopy Unit  Anesthesia Type:General  Level of Consciousness: drowsy  Airway & Oxygen Therapy: Patient Spontanous Breathing  Post-op Assessment: Report given to RN  Post vital signs: stable  Last Vitals:  Vitals Value Taken Time  BP    Temp    Pulse    Resp    SpO2      Last Pain:  Vitals:   01/15/21 0944  TempSrc: Temporal  PainSc: 0-No pain         Complications: No notable events documented.

## 2021-01-15 NOTE — Anesthesia Preprocedure Evaluation (Signed)
Anesthesia Evaluation  Patient identified by MRN, date of birth, ID band Patient awake    Reviewed: Allergy & Precautions, NPO status , Patient's Chart, lab work & pertinent test results  History of Anesthesia Complications Negative for: history of anesthetic complications  Airway Mallampati: II  TM Distance: >3 FB Neck ROM: Full    Dental no notable dental hx. (+) Teeth Intact   Pulmonary neg pulmonary ROS, neg sleep apnea, neg COPD, Patient abstained from smoking.Not current smoker,    Pulmonary exam normal breath sounds clear to auscultation       Cardiovascular Exercise Tolerance: Good METShypertension, Pt. on medications (-) CAD and (-) Past MI (-) dysrhythmias  Rhythm:Regular Rate:Normal - Systolic murmurs    Neuro/Psych negative neurological ROS  negative psych ROS   GI/Hepatic neg GERD  ,(+)     (-) substance abuse  ,   Endo/Other  neg diabetes  Renal/GU negative Renal ROS     Musculoskeletal   Abdominal (+) + obese,   Peds  Hematology   Anesthesia Other Findings Past Medical History: No date: Hypercholesteremia No date: Hypertension  Reproductive/Obstetrics                             Anesthesia Physical Anesthesia Plan  ASA: 2  Anesthesia Plan: General   Post-op Pain Management:    Induction: Intravenous  PONV Risk Score and Plan: 3 and Ondansetron, Propofol infusion and TIVA  Airway Management Planned: Nasal Cannula  Additional Equipment: None  Intra-op Plan:   Post-operative Plan:   Informed Consent: I have reviewed the patients History and Physical, chart, labs and discussed the procedure including the risks, benefits and alternatives for the proposed anesthesia with the patient or authorized representative who has indicated his/her understanding and acceptance.     Dental advisory given  Plan Discussed with: CRNA and Surgeon  Anesthesia Plan  Comments: (Discussed risks of anesthesia with patient, including possibility of difficulty with spontaneous ventilation under anesthesia necessitating airway intervention, PONV, and rare risks such as cardiac or respiratory or neurological events, and allergic reactions. Patient understands.)        Anesthesia Quick Evaluation

## 2021-01-15 NOTE — Consult Note (Signed)
Vonda Antigua, MD [address] [place], [place], [place], [address] [address], [place], [place], [place], [address] Phone: [phone]  Fax: [phone]  Primary Care Physician:  Frazier Richards, MD   Pre-Procedure History & Physical: HPI:  Debra Delacruz is a 70 y.o. female is here for a colonoscopy.   Past Medical History:  Diagnosis Date   Hypercholesteremia    Hypertension     Past Surgical History:  Procedure Laterality Date   BREAST BIOPSY     BREAST BIOPSY Left    benign-years ago   [place]      Prior to Admission medications   Medication Sig Start Date End Date Taking? Authorizing Provider  amLODipine (NORVASC) 5 MG tablet Take 5 mg by mouth daily.   Yes [provider]  aspirin 81 MG EC tablet Take by mouth.   Yes [provider]  carvedilol (COREG) 12.5 MG tablet Take 12.5 mg by mouth 2 (two) times daily with a meal.   Yes [provider]  clotrimazole-betamethasone (LOTRISONE) cream Apply 1 application topically 2 (two) times daily. 03/26/20  Yes Felipa Furnace, DPM  Omega-3 1000 MG CAPS Take by mouth.   Yes [provider]  omeprazole (PRILOSEC) 40 MG capsule Take by mouth. 12/18/12  Yes [provider]  pravastatin (PRAVACHOL) 20 MG tablet Take 20 mg by mouth daily.   Yes [provider]  pseudoephedrine-acetaminophen (TYLENOL SINUS) 30-500 MG TABS tablet Take 1 tablet by mouth every 4 (four) hours as needed.   Yes [provider]  ranitidine (ZANTAC) 150 MG tablet Take by mouth. 12/18/12  Yes [provider]  dicyclomine (BENTYL) 20 MG tablet Take 20 mg by mouth 4 (four) times daily as needed. Patient not taking: Reported on 01/15/2021 01/09/20   [provider]  doxycycline (VIBRA-TABS) 100 MG tablet Take 1 tablet (100 mg total) by mouth 2 (two) times daily. Patient not taking: Reported on 01/15/2021 04/08/20   Felipa Furnace, DPM   HYDROcodone-acetaminophen (NORCO/VICODIN) 5-325 MG tablet Take 1 tablet by mouth every 4 (four) hours as needed for moderate pain. 01/08/16   Cuthriell, Charline Bills, PA-C  meloxicam (MOBIC) 15 MG tablet Take 1 tablet (15 mg total) by mouth daily. 01/08/16   Cuthriell, Charline Bills, PA-C  methylPREDNISolone (MEDROL DOSEPAK) 4 MG TBPK tablet Take Tapered dose as directed 07/28/18   Sable Feil, PA-C  promethazine-dextromethorphan (PROMETHAZINE-DM) 6.25-15 MG/5ML syrup Take 5 mLs by mouth 4 (four) times daily as needed for cough. 07/28/18   Sable Feil, PA-C    Allergies as of 12/31/2020 - Review Complete 12/31/2020  Allergen Reaction Noted   Penicillins Anaphylaxis 01/07/2015   Sulfa antibiotics Anaphylaxis 01/07/2015   Sulfasalazine Anaphylaxis 01/07/2015    Family History  Problem Relation Age of Onset   Breast cancer Neg Hx     Social History   Socioeconomic History   Marital status: Divorced    Spouse name: Not on file   Number of children: Not on file   Years of education: Not on file   Highest education level: Not on file  Occupational History   Not on file  Tobacco Use   Smoking status: Never   Smokeless tobacco: Never  Vaping Use   Vaping Use: Never used  Substance and Sexual Activity   Alcohol use: No   Drug use: No   Sexual activity: Not on file  Other Topics Concern   Not  on file  Social History Narrative   Not on file   Social Determinants of Health   Financial Resource Strain: Not on file  Food Insecurity: Not on file  Transportation Needs: Not on file  Physical Activity: Not on file  Stress: Not on file  Social Connections: Not on file  Intimate Partner Violence: Not on file    Review of Systems: See HPI, otherwise negative ROS  Physical Exam: Constitutional: General:   Alert,  Well-developed, well-nourished, pleasant and cooperative in NAD BP 137/89   Pulse 70   Temp (!) 96.9 F (36.1 C) (Temporal)   Resp 20   Ht '5\' 7"'$  (1.702 m)   Wt  109.9 kg   SpO2 95%   BMI 37.95 kg/m   Head: Normocephalic, atraumatic.   Eyes:  Sclera clear, no icterus.   Conjunctiva pink.   Mouth:  No deformity or lesions, oropharynx pink & moist.  Neck:  Supple, trachea midline  Respiratory: Normal respiratory effort  Gastrointestinal:  Soft, non-tender and non-distended without masses, hepatosplenomegaly or hernias noted.  No guarding or rebound tenderness.     Cardiac: No clubbing or edema.  No cyanosis. Normal posterior tibial pedal pulses noted.  Lymphatic:  No significant cervical adenopathy.  Psych:  Alert and cooperative. Normal mood and affect.  Musculoskeletal:   Symmetrical without gross deformities. 5/5 Lower extremity strength bilaterally.  Skin: Warm. Intact without significant lesions or rashes. No jaundice.  Neurologic:  Face symmetrical, tongue midline, Normal sensation to touch;  grossly normal neurologically.  Psych:  Alert and oriented x3, Alert and cooperative. Normal mood and affect.  Impression/Plan: Debra Delacruz is here for a colonoscopy to be performed for average risk screening.  Risks, benefits, limitations, and alternatives regarding  colonoscopy have been reviewed with the patient.  Questions have been answered.  All parties agreeable.   Virgel Manifold, MD  01/15/2021, 10:27 AM

## 2021-01-15 NOTE — Op Note (Signed)
[facility] Patient Name: Debra Delacruz Procedure Date: 01/15/2021 10:17 AM MRN: [id number] Account #: [id number] Date of Birth: [birth year] Admit Type: Outpatient Age: 70 Room: [facility] ENDO ROOM 2 Gender: Female Note Status: Finalized Procedure:             Colonoscopy Indications:           Screening for colorectal malignant neoplasm Providers:             Wheeler Incorvaia B. Bonna Gains MD, MD Referring MD:          Hattie Perch. Sherril Cong (Referring MD) Medicines:             Monitored Anesthesia Care Complications:         No immediate complications. Procedure:             Pre-Anesthesia Assessment:                        - ASA Grade Assessment: II - A patient with mild                         systemic disease.                        - Prior to the procedure, a History and Physical was                         performed, and patient medications, allergies and                         sensitivities were reviewed. The patient's tolerance                         of previous anesthesia was reviewed.                        - The risks and benefits of the procedure and the                         sedation options and risks were discussed with the                         patient. All questions were answered and informed                         consent was obtained.                        - Patient identification and proposed procedure were                         verified prior to the procedure by the physician, the                         nurse, the anesthesiologist, the anesthetist and the                         technician. The procedure was verified in the                         procedure room.  After obtaining informed consent, the colonoscope was                         passed under direct vision. Throughout the procedure,                         the patient's blood pressure, pulse, and oxygen                         saturations were monitored  continuously. The                         Colonoscope was introduced through the anus and                         advanced to the the cecum, identified by appendiceal                         orifice and ileocecal valve. The colonoscopy was                         performed with ease. The patient tolerated the                         procedure well. The quality of the bowel preparation                         was good. Findings:      The perianal and digital rectal examinations were normal.      Two sessile polyps were found in the cecum. The polyps were 2 to 3 mm in       size. These polyps were removed with a cold biopsy forceps. Resection       and retrieval were complete.      Two sessile polyps were found in the descending colon. The polyps were 2       to 3 mm in size. These polyps were removed with a cold biopsy forceps.       Resection and retrieval were complete.      Two sessile polyps were found in the sigmoid colon. The polyps were 8 to       10 mm in size. These polyps were removed with a cold snare. Resection       and retrieval were complete.      Multiple diverticula were found in the sigmoid colon.      The exam was otherwise without abnormality.      The rectum, sigmoid colon, descending colon, transverse colon, ascending       colon and cecum appeared normal.      Non-bleeding internal hemorrhoids were found during retroflexion.      No additional abnormalities were found on retroflexion. Impression:            - Two 2 to 3 mm polyps in the cecum, removed with a                         cold biopsy forceps. Resected and retrieved.                        - Two 2 to 3 mm polyps in  the descending colon,                         removed with a cold biopsy forceps. Resected and                         retrieved.                        - Two 8 to 10 mm polyps in the sigmoid colon, removed                         with a cold snare. Resected and retrieved.                         - Diverticulosis in the sigmoid colon.                        - The examination was otherwise normal.                        - The rectum, sigmoid colon, descending colon,                         transverse colon, ascending colon and cecum are normal.                        - Non-bleeding internal hemorrhoids. Recommendation:        - Discharge patient to home (with escort).                        - High fiber diet.                        - Advance diet as tolerated.                        - Continue present medications.                        - Await pathology results.                        - Repeat colonoscopy date to be determined after                         pending pathology results are reviewed.                        - The findings and recommendations were discussed with                         the patient.                        - The findings and recommendations were discussed with                         the patient's family.                        - Return to primary  care physician as previously                         scheduled. Procedure Code(s):     --- Professional ---                        [phone], Colonoscopy, flexible; with removal of                         tumor(s), polyp(s), or other lesion(s) by snare                         technique                        45380, 87, Colonoscopy, flexible; with biopsy, single                         or multiple Diagnosis Code(s):     --- Professional ---                        Z12.11, Encounter for screening for malignant neoplasm                         of colon                        K63.5, Polyp of colon CPT copyright 2019 American Medical Association. All rights reserved. The codes documented in this report are preliminary and upon coder review may  be revised to meet current compliance requirements.  Vonda Antigua, MD Margretta Sidle B. Bonna Gains MD, MD 01/15/2021 11:25:59 AM This report has been signed  electronically. Number of Addenda: 0 Note Initiated On: 01/15/2021 10:17 AM Scope Withdrawal Time: 0 hours 36 minutes 32 seconds  Total Procedure Duration: 0 hours 44 minutes 15 seconds  Estimated Blood Loss:  Estimated blood loss: none.      [facility]

## 2021-01-15 NOTE — Anesthesia Postprocedure Evaluation (Signed)
Anesthesia Post Note  Patient: Debra Delacruz  Procedure(s) Performed: COLONOSCOPY WITH PROPOFOL  Patient location during evaluation: Endoscopy Anesthesia Type: General Level of consciousness: awake and alert Pain management: pain level controlled Vital Signs Assessment: post-procedure vital signs reviewed and stable Respiratory status: spontaneous breathing, nonlabored ventilation, respiratory function stable and patient connected to nasal cannula oxygen Cardiovascular status: blood pressure returned to baseline and stable Postop Assessment: no apparent nausea or vomiting Anesthetic complications: no   No notable events documented.   Last Vitals:  Vitals:   01/15/21 1132 01/15/21 1142  BP: 120/82 134/87  Pulse:    Resp:    Temp:    SpO2:      Last Pain:  Vitals:   01/15/21 1152  TempSrc:   PainSc: 0-No pain                 Arita Miss

## 2021-01-18 ENCOUNTER — Encounter: Payer: Self-pay | Admitting: Gastroenterology

## 2021-01-18 LAB — SURGICAL PATHOLOGY

## 2021-01-22 ENCOUNTER — Encounter: Payer: Self-pay | Admitting: Gastroenterology

## 2021-05-11 ENCOUNTER — Telehealth: Payer: Self-pay | Admitting: *Deleted

## 2021-05-11 NOTE — Telephone Encounter (Signed)
"  I had surgery last October 4.  Dr. Posey Pronto did it.   I'm concerned about my toe now.  If I could schedule an appointment for Dr. Posey Pronto to look at my toe to make sure no screws or anything have come a loose.  Give me a call back."

## 2021-05-18 ENCOUNTER — Ambulatory Visit: Payer: Medicare Other | Admitting: Podiatry

## 2021-10-07 DIAGNOSIS — M7120 Synovial cyst of popliteal space [Baker], unspecified knee: Secondary | ICD-10-CM | POA: Insufficient documentation

## 2021-12-07 ENCOUNTER — Ambulatory Visit: Payer: Self-pay | Admitting: Podiatry

## 2021-12-16 ENCOUNTER — Ambulatory Visit: Payer: Self-pay | Admitting: Podiatry

## 2021-12-21 ENCOUNTER — Ambulatory Visit: Payer: Self-pay | Admitting: Podiatry

## 2022-01-06 ENCOUNTER — Ambulatory Visit: Payer: Self-pay | Admitting: Podiatry

## 2022-07-06 ENCOUNTER — Other Ambulatory Visit: Payer: Self-pay | Admitting: Family Medicine

## 2022-07-06 DIAGNOSIS — Z1231 Encounter for screening mammogram for malignant neoplasm of breast: Secondary | ICD-10-CM

## 2022-09-20 ENCOUNTER — Ambulatory Visit
Admission: RE | Admit: 2022-09-20 | Discharge: 2022-09-20 | Disposition: A | Discharge status: Home / Self Care | Payer: Self-pay | Source: Ambulatory Visit | Attending: Family Medicine | Admitting: Family Medicine

## 2022-09-20 DIAGNOSIS — Z1231 Encounter for screening mammogram for malignant neoplasm of breast: Secondary | ICD-10-CM | POA: Insufficient documentation

## 2022-09-23 ENCOUNTER — Encounter: Payer: Self-pay | Admitting: Family Medicine

## 2022-09-23 DIAGNOSIS — N6489 Other specified disorders of breast: Secondary | ICD-10-CM

## 2022-09-23 DIAGNOSIS — R928 Other abnormal and inconclusive findings on diagnostic imaging of breast: Secondary | ICD-10-CM

## 2022-10-05 ENCOUNTER — Other Ambulatory Visit: Payer: Self-pay | Admitting: Family Medicine

## 2022-10-05 DIAGNOSIS — N6489 Other specified disorders of breast: Secondary | ICD-10-CM

## 2022-10-05 DIAGNOSIS — R928 Other abnormal and inconclusive findings on diagnostic imaging of breast: Secondary | ICD-10-CM

## 2022-10-12 ENCOUNTER — Ambulatory Visit
Admission: RE | Admit: 2022-10-12 | Discharge: 2022-10-12 | Disposition: A | Discharge status: Home / Self Care | Payer: Medicare HMO | Source: Ambulatory Visit | Attending: Family Medicine | Admitting: Family Medicine

## 2022-10-12 DIAGNOSIS — R928 Other abnormal and inconclusive findings on diagnostic imaging of breast: Secondary | ICD-10-CM

## 2022-10-12 DIAGNOSIS — N6489 Other specified disorders of breast: Secondary | ICD-10-CM

## 2022-12-22 ENCOUNTER — Ambulatory Visit: Payer: Medicare HMO | Admitting: Podiatry

## 2023-01-10 ENCOUNTER — Ambulatory Visit: Payer: Medicare HMO | Admitting: Podiatry

## 2023-02-08 IMAGING — MG MM DIGITAL SCREENING BILAT W/ TOMO AND CAD
6 of 10 series · 6 of 30 positions shown · non-contrast
Comparison: Previous exam(s).

CLINICAL DATA: Screening.

EXAM:
DIGITAL SCREENING BILATERAL MAMMOGRAM WITH TOMOSYNTHESIS AND CAD
TECHNIQUE: Bilateral screening digital craniocaudal and mediolateral oblique
mammograms were obtained. Bilateral screening digital breast
tomosynthesis was performed. The images were evaluated with
computer-aided detection.

[L CC synth-2D]
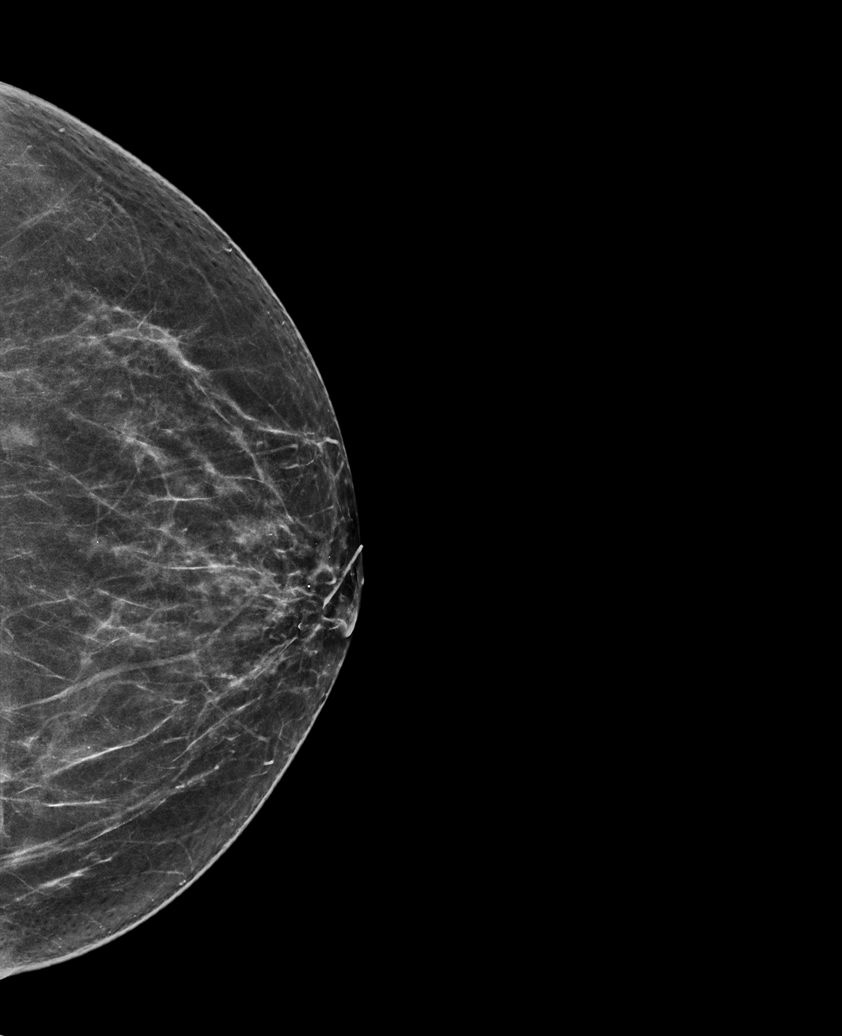

[R CC synth-2D]
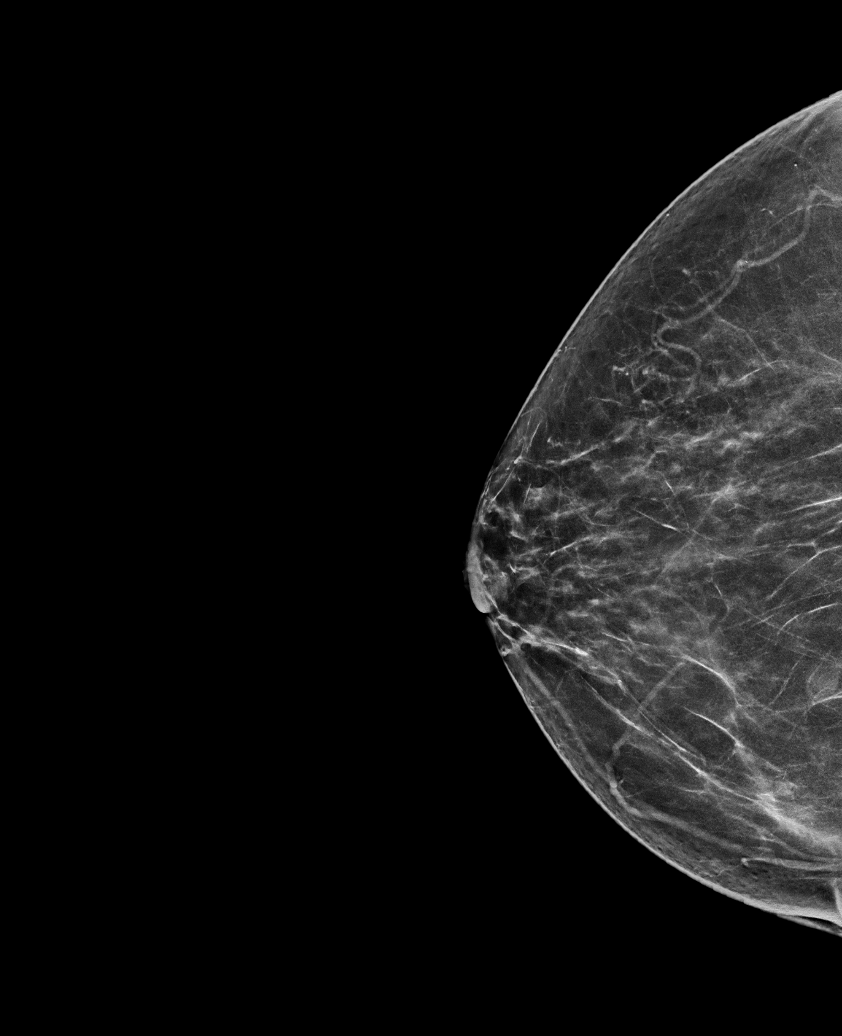

[L MLO synth-2D]
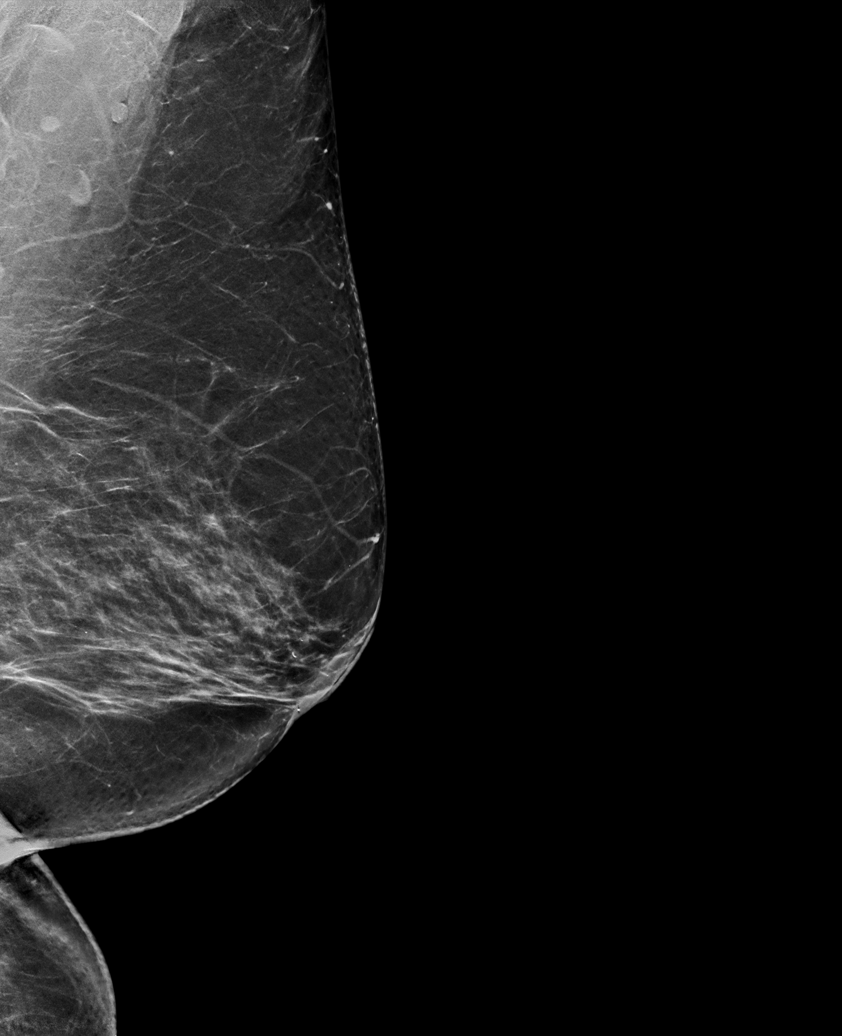

[R MLO synth-2D (1 of 2)]
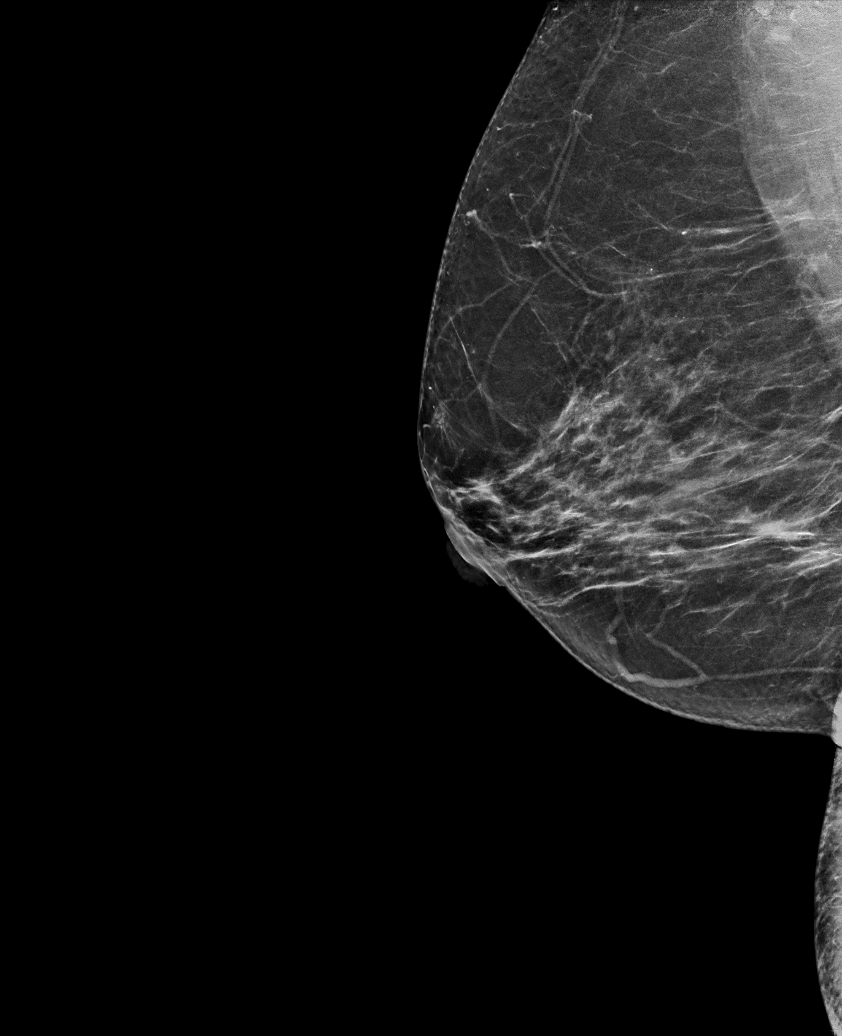

[R MLO synth-2D (2 of 2)]
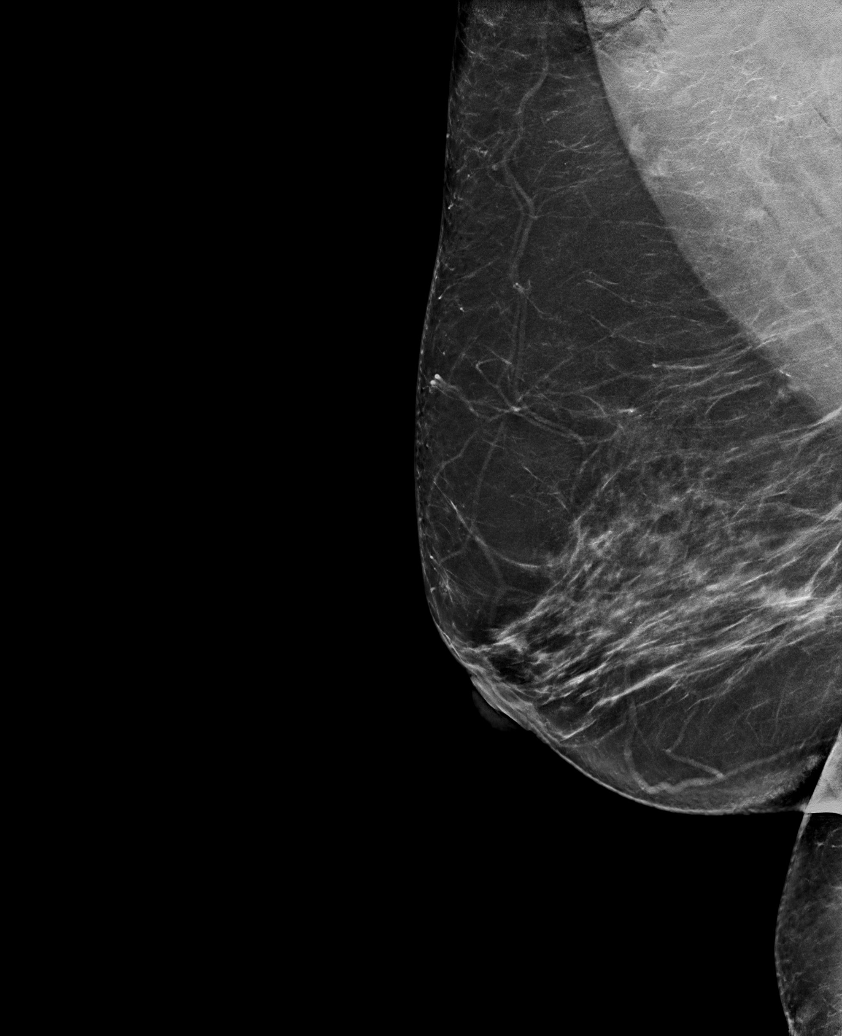

[R CC tomo · tomo slice 35/68.0]
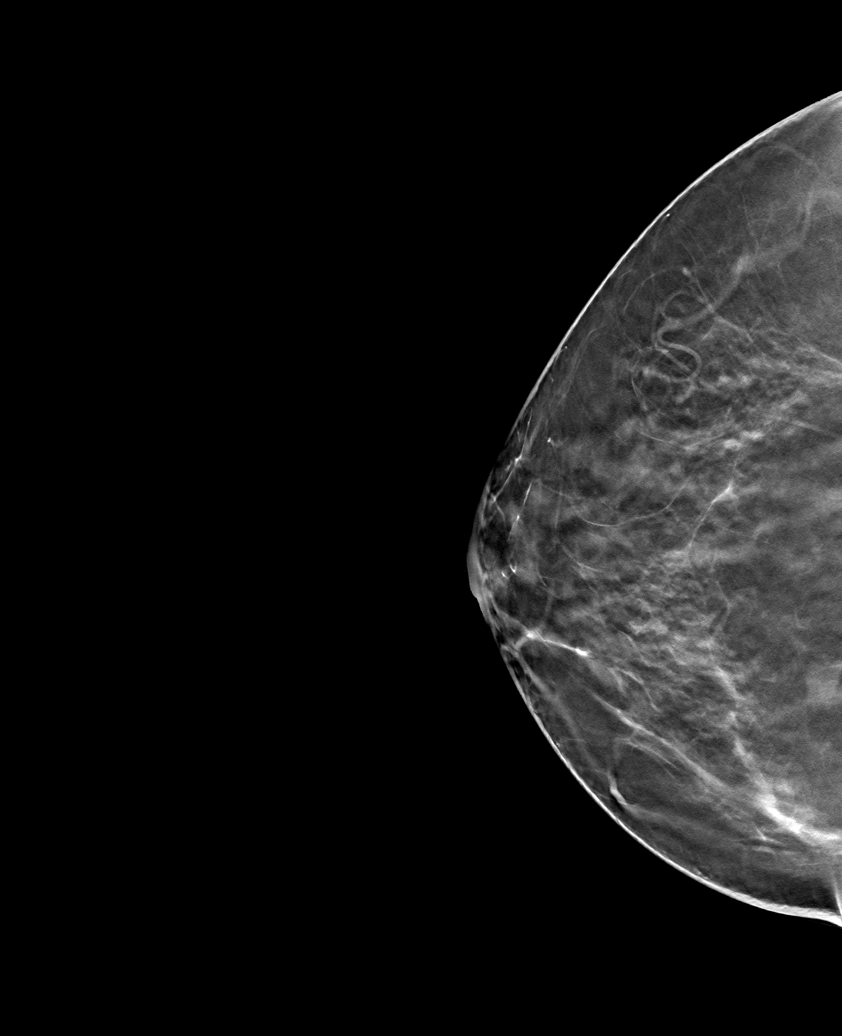

[6 of 30 positions shown; findings below may reference images not displayed]

ACR Breast Density Category b: There are scattered areas of
fibroglandular density.
FINDINGS: There are no findings suspicious for malignancy.
IMPRESSION: No mammographic evidence of malignancy. A result letter of this
screening mammogram will be mailed directly to the patient.

RECOMMENDATION:
Screening mammogram in one year. (Code:51-O-LD2)

BI-RADS CATEGORY  1: Negative.

## 2023-04-04 ENCOUNTER — Ambulatory Visit: Payer: Medicare HMO | Admitting: Podiatry

## 2023-04-04 DIAGNOSIS — M7751 Other enthesopathy of right foot: Secondary | ICD-10-CM

## 2023-04-04 DIAGNOSIS — L909 Atrophic disorder of skin, unspecified: Secondary | ICD-10-CM | POA: Diagnosis not present

## 2023-04-04 DIAGNOSIS — I739 Peripheral vascular disease, unspecified: Secondary | ICD-10-CM | POA: Insufficient documentation

## 2023-04-04 NOTE — Progress Notes (Signed)
Subjective:  Patient ID: Debra Delacruz, female    DOB: 10/24/1950,  MRN: [id number]  No chief complaint on file.   72 y.o. female presents with the above complaint.  Patient presents with complaint of right second metatarsophalangeal joint capsulitis.  Patient states that it is painful to touch is progressive gotten worse worse with ambulation worse with pressure did not see anyone else prior to seeing me.  Denies any other acute complaints.  She got a little bit of plantar fat pad atrophy as well.   Review of Systems: Negative except as noted in the HPI. Denies N/V/F/Ch.  Past Medical History:  Diagnosis Date   Hypercholesteremia    Hypertension     Current Outpatient Medications:    amLODipine (NORVASC) 5 MG tablet, Take 5 mg by mouth daily., Disp: , Rfl:    aspirin 81 MG EC tablet, Take by mouth., Disp: , Rfl:    carvedilol (COREG) 12.5 MG tablet, Take 12.5 mg by mouth 2 (two) times daily with a meal., Disp: , Rfl:    clotrimazole-betamethasone (LOTRISONE) cream, Apply 1 application topically 2 (two) times daily., Disp: 30 g, Rfl: 0   dicyclomine (BENTYL) 20 MG tablet, Take 20 mg by mouth 4 (four) times daily as needed. (Patient not taking: Reported on 01/15/2021), Disp: , Rfl:    doxycycline (VIBRA-TABS) 100 MG tablet, Take 1 tablet (100 mg total) by mouth 2 (two) times daily. (Patient not taking: Reported on 01/15/2021), Disp: 20 tablet, Rfl: 0   HYDROcodone-acetaminophen (NORCO/VICODIN) 5-325 MG tablet, Take 1 tablet by mouth every 4 (four) hours as needed for moderate pain., Disp: 10 tablet, Rfl: 0   meloxicam (MOBIC) 15 MG tablet, Take 1 tablet (15 mg total) by mouth daily., Disp: 30 tablet, Rfl: 0   methylPREDNISolone (MEDROL DOSEPAK) 4 MG TBPK tablet, Take Tapered dose as directed, Disp: 21 tablet, Rfl: 0   Omega-3 1000 MG CAPS, Take by mouth., Disp: , Rfl:    omeprazole (PRILOSEC) 40 MG capsule, Take by mouth., Disp: , Rfl:    pravastatin (PRAVACHOL) 20 MG tablet, Take 20 mg by  mouth daily., Disp: , Rfl:    promethazine-dextromethorphan (PROMETHAZINE-DM) 6.25-15 MG/5ML syrup, Take 5 mLs by mouth 4 (four) times daily as needed for cough., Disp: 118 mL, Rfl: 0   pseudoephedrine-acetaminophen (TYLENOL SINUS) 30-500 MG TABS tablet, Take 1 tablet by mouth every 4 (four) hours as needed., Disp: , Rfl:    ranitidine (ZANTAC) 150 MG tablet, Take by mouth., Disp: , Rfl:   Social History   Tobacco Use  Smoking Status Never  Smokeless Tobacco Never    Allergies  Allergen Reactions   Penicillins Anaphylaxis   Sulfa Antibiotics Anaphylaxis   Sulfasalazine Anaphylaxis   Objective:  There were no vitals filed for this visit. There is no height or weight on file to calculate BMI. Constitutional Well developed. Well nourished.  Vascular Dorsalis pedis pulses palpable bilaterally. Posterior tibial pulses palpable bilaterally. Capillary refill normal to all digits.  No cyanosis or clubbing noted. Pedal hair growth normal.  Neurologic Normal speech. Oriented to person, place, and time. Epicritic sensation to light touch grossly present bilaterally.  Dermatologic Nails well groomed and normal in appearance. No open wounds. No skin lesions.  Orthopedic: Pain on palpation right second metatarsophalangeal joint pain with range of motion of the joint deep intra-articular pain noted.  No pain at the flexor or extensor tendinitis.  Plantar fat pad atrophy noted   Radiographs: None Assessment:   1. Capsulitis of  metatarsophalangeal (MTP) joint of right foot   2. Plantar fat pad atrophy    Plan:  Patient was evaluated and treated and all questions answered.  Right second metatarsophalangeal joint capsulitis with underlying plantar fat pad atrophy -All questions and concerns were discussed with the patient extensive detail given the amount of pain that she is unsure benefit from a steroid injection help decrease inflammatory complaints of shoulder pain.  Patient agrees with  plan like to proceed with steroid injection -A steroid injection was performed at Right second Mtpj  using 1% plain Lidocaine and 10 mg of Kenalog. This was well tolerated. -I explained the patient etiology of plantar fat pad atrophy and very treatment options were discussed shoe gear modification and metatarsal pads were discussed.   No follow-ups on file.

## 2023-04-12 ENCOUNTER — Other Ambulatory Visit: Payer: Self-pay | Admitting: Family Medicine

## 2023-04-12 DIAGNOSIS — M316 Other giant cell arteritis: Secondary | ICD-10-CM

## 2023-04-21 ENCOUNTER — Ambulatory Visit
Admission: RE | Admit: 2023-04-21 | Discharge: 2023-04-21 | Disposition: A | Discharge status: Home / Self Care | Payer: Medicare HMO | Source: Ambulatory Visit | Attending: Family Medicine | Admitting: Family Medicine

## 2023-04-21 DIAGNOSIS — M316 Other giant cell arteritis: Secondary | ICD-10-CM | POA: Insufficient documentation

## 2023-05-01 ENCOUNTER — Other Ambulatory Visit: Payer: Self-pay | Admitting: Family Medicine

## 2023-05-01 DIAGNOSIS — R1032 Left lower quadrant pain: Secondary | ICD-10-CM

## 2023-05-02 ENCOUNTER — Encounter: Payer: Self-pay | Admitting: Podiatry

## 2023-05-02 ENCOUNTER — Ambulatory Visit: Payer: Medicare HMO | Admitting: Podiatry

## 2023-05-02 VITALS — Ht 67.0 in | Wt 242.3 lb

## 2023-05-02 DIAGNOSIS — M7751 Other enthesopathy of right foot: Secondary | ICD-10-CM | POA: Diagnosis not present

## 2023-05-02 NOTE — Progress Notes (Signed)
Subjective:  Patient ID: Debra Delacruz, female    DOB: 12-30-1950,  MRN: [id number]  Chief Complaint  Patient presents with   Foot Pain    Pt is here to f/u on right foot pain.    72 y.o. female presents with the above complaint.  Patient has to follow-up for right second metatarsophalangeal joint capsulitis.  He is she states she is doing a lot better.  No further pain.  Injection helped  Review of Systems: Negative except as noted in the HPI. Denies N/V/F/Ch.  Past Medical History:  Diagnosis Date   Hypercholesteremia    Hypertension     Current Outpatient Medications:    amLODipine (NORVASC) 5 MG tablet, Take 5 mg by mouth daily., Disp: , Rfl:    aspirin 81 MG EC tablet, Take by mouth., Disp: , Rfl:    carvedilol (COREG) 12.5 MG tablet, Take 12.5 mg by mouth 2 (two) times daily with a meal., Disp: , Rfl:    clotrimazole-betamethasone (LOTRISONE) cream, Apply 1 application topically 2 (two) times daily., Disp: 30 g, Rfl: 0   dicyclomine (BENTYL) 20 MG tablet, Take 20 mg by mouth 4 (four) times daily as needed., Disp: , Rfl:    doxycycline (VIBRA-TABS) 100 MG tablet, Take 1 tablet (100 mg total) by mouth 2 (two) times daily., Disp: 20 tablet, Rfl: 0   HYDROcodone-acetaminophen (NORCO/VICODIN) 5-325 MG tablet, Take 1 tablet by mouth every 4 (four) hours as needed for moderate pain., Disp: 10 tablet, Rfl: 0   meloxicam (MOBIC) 15 MG tablet, Take 1 tablet (15 mg total) by mouth daily., Disp: 30 tablet, Rfl: 0   methylPREDNISolone (MEDROL DOSEPAK) 4 MG TBPK tablet, Take Tapered dose as directed, Disp: 21 tablet, Rfl: 0   Omega-3 1000 MG CAPS, Take by mouth., Disp: , Rfl:    omeprazole (PRILOSEC) 40 MG capsule, Take by mouth., Disp: , Rfl:    pravastatin (PRAVACHOL) 20 MG tablet, Take 20 mg by mouth daily., Disp: , Rfl:    promethazine-dextromethorphan (PROMETHAZINE-DM) 6.25-15 MG/5ML syrup, Take 5 mLs by mouth 4 (four) times daily as needed for cough., Disp: 118 mL, Rfl: 0    pseudoephedrine-acetaminophen (TYLENOL SINUS) 30-500 MG TABS tablet, Take 1 tablet by mouth every 4 (four) hours as needed., Disp: , Rfl:    ranitidine (ZANTAC) 150 MG tablet, Take by mouth., Disp: , Rfl:   Social History   Tobacco Use  Smoking Status Never  Smokeless Tobacco Never    Allergies  Allergen Reactions   Penicillins Anaphylaxis   Sulfa Antibiotics Anaphylaxis   Sulfasalazine Anaphylaxis   Objective:  There were no vitals filed for this visit. Body mass index is 37.95 kg/m. Constitutional Well developed. Well nourished.  Vascular Dorsalis pedis pulses palpable bilaterally. Posterior tibial pulses palpable bilaterally. Capillary refill normal to all digits.  No cyanosis or clubbing noted. Pedal hair growth normal.  Neurologic Normal speech. Oriented to person, place, and time. Epicritic sensation to light touch grossly present bilaterally.  Dermatologic Nails well groomed and normal in appearance. No open wounds. No skin lesions.  Orthopedic: No further pain on palpation right second metatarsophalangeal joint pain with range of motion of the joint deep intra-articular pain noted.  No pain at the flexor or extensor tendinitis.  Plantar fat pad atrophy noted   Radiographs: None Assessment:   No diagnosis found.  Plan:  Patient was evaluated and treated and all questions answered.  Right second metatarsophalangeal joint capsulitis with underlying plantar fat pad atrophy -All questions and  concerns were discussed with the patient extensive detail clinically healed and officially just discharged from my care.  I discussed shoe gear modification as well as metatarsal padding.  If any foot and ankle issues arise in fracture future she will come back and see me.  No follow-ups on file.

## 2023-05-09 ENCOUNTER — Ambulatory Visit
Admission: RE | Admit: 2023-05-09 | Discharge: 2023-05-09 | Disposition: A | Discharge status: Home / Self Care | Payer: Medicare HMO | Source: Ambulatory Visit | Attending: Family Medicine | Admitting: Family Medicine

## 2023-05-09 DIAGNOSIS — R1032 Left lower quadrant pain: Secondary | ICD-10-CM | POA: Insufficient documentation

## 2023-05-09 LAB — POCT I-STAT CREATININE: Creatinine, Ser: 0.9 mg/dL (ref 0.44–1.00)

## 2023-05-09 MED ORDER — IOHEXOL 300 MG/ML  SOLN
100.0000 mL | Freq: Once | INTRAMUSCULAR | Status: AC | PRN
  Administered 2023-05-09: 100 mL via INTRAVENOUS

## 2023-06-09 DIAGNOSIS — K5790 Diverticulosis of intestine, part unspecified, without perforation or abscess without bleeding: Secondary | ICD-10-CM | POA: Insufficient documentation

## 2023-06-09 DIAGNOSIS — M7061 Trochanteric bursitis, right hip: Secondary | ICD-10-CM | POA: Insufficient documentation

## 2023-10-10 DIAGNOSIS — M79605 Pain in left leg: Secondary | ICD-10-CM | POA: Insufficient documentation

## 2023-10-24 ENCOUNTER — Other Ambulatory Visit: Payer: Self-pay | Admitting: Sports Medicine

## 2023-10-24 DIAGNOSIS — G8929 Other chronic pain: Secondary | ICD-10-CM

## 2023-10-24 DIAGNOSIS — M4807 Spinal stenosis, lumbosacral region: Secondary | ICD-10-CM

## 2023-10-24 DIAGNOSIS — M48061 Spinal stenosis, lumbar region without neurogenic claudication: Secondary | ICD-10-CM

## 2023-10-24 DIAGNOSIS — M47816 Spondylosis without myelopathy or radiculopathy, lumbar region: Secondary | ICD-10-CM

## 2023-10-24 DIAGNOSIS — M51371 Other intervertebral disc degeneration, lumbosacral region with lower extremity pain only: Secondary | ICD-10-CM

## 2023-10-28 ENCOUNTER — Ambulatory Visit
Admission: RE | Admit: 2023-10-28 | Discharge: 2023-10-28 | Disposition: A | Discharge status: Home / Self Care | Source: Ambulatory Visit | Attending: Sports Medicine | Admitting: Sports Medicine

## 2023-10-28 DIAGNOSIS — M5442 Lumbago with sciatica, left side: Secondary | ICD-10-CM | POA: Diagnosis present

## 2023-10-28 DIAGNOSIS — M5441 Lumbago with sciatica, right side: Secondary | ICD-10-CM | POA: Insufficient documentation

## 2023-10-28 DIAGNOSIS — G8929 Other chronic pain: Secondary | ICD-10-CM | POA: Insufficient documentation

## 2023-10-28 DIAGNOSIS — M48061 Spinal stenosis, lumbar region without neurogenic claudication: Secondary | ICD-10-CM | POA: Insufficient documentation

## 2023-10-28 DIAGNOSIS — M47816 Spondylosis without myelopathy or radiculopathy, lumbar region: Secondary | ICD-10-CM | POA: Insufficient documentation

## 2023-10-28 DIAGNOSIS — M4807 Spinal stenosis, lumbosacral region: Secondary | ICD-10-CM | POA: Diagnosis present

## 2023-10-28 DIAGNOSIS — M51371 Other intervertebral disc degeneration, lumbosacral region with lower extremity pain only: Secondary | ICD-10-CM | POA: Insufficient documentation

## 2023-11-17 ENCOUNTER — Telehealth: Payer: Self-pay

## 2023-11-17 NOTE — Telephone Encounter (Signed)
 Pt requesting call back to schedule colonoscopy.

## 2023-11-20 ENCOUNTER — Telehealth: Payer: Self-pay

## 2023-11-20 ENCOUNTER — Other Ambulatory Visit: Payer: Self-pay

## 2023-11-20 DIAGNOSIS — Z860100 Personal history of colon polyps, unspecified: Secondary | ICD-10-CM

## 2023-11-20 MED ORDER — PEG 3350-KCL-NA BICARB-NACL 420 G PO SOLR: 4000.0000 mL | Freq: Once | ORAL | 0 refills | Status: DC

## 2023-11-20 NOTE — Progress Notes (Signed)
 Gastroenterology Pre-Procedure Review   Request Date: 02/27/24 Requesting Physician: Dr. Ole Berkeley   PATIENT REVIEW QUESTIONS: The patient responded to the following health history questions as indicated:     1. Are you having any GI issues? no 2. Do you have a personal history of Polyps? Yes last colonoscopy performed by Dr. Tully Gainer 01/15/21 recommended repeat in 3 years 3. Do you have a family history of Colon Cancer or Polyps? yes (Aunt colon polyps) 4. Diabetes Mellitus? no 5. Joint replacements in the past 12 months?no 6. Major health problems in the past 3 months?no 7. Any artificial heart valves, MVP, or defibrillator?no    MEDICATIONS & ALLERGIES:    Patient reports the following regarding taking any anticoagulation/antiplatelet therapy:   Plavix, Coumadin, Eliquis, Xarelto, Lovenox, Pradaxa, Brilinta, or Effient? no Aspirin? yes (81 mg)   Patient confirms/reports the following medications:  Current Outpatient Medications  Medication Sig Dispense Refill   amLODipine (NORVASC) 5 MG tablet Take 5 mg by mouth daily.     aspirin 81 MG EC tablet Take by mouth.     carvedilol (COREG) 12.5 MG tablet Take 12.5 mg by mouth 2 (two) times daily with a meal.     clotrimazole -betamethasone  (LOTRISONE ) cream Apply 1 application topically 2 (two) times daily. 30 g 0   dicyclomine (BENTYL) 20 MG tablet Take 20 mg by mouth 4 (four) times daily as needed.     doxycycline  (VIBRA -TABS) 100 MG tablet Take 1 tablet (100 mg total) by mouth 2 (two) times daily. 20 tablet 0   HYDROcodone -acetaminophen  (NORCO/VICODIN) 5-325 MG tablet Take 1 tablet by mouth every 4 (four) hours as needed for moderate pain. 10 tablet 0   meloxicam  (MOBIC ) 15 MG tablet Take 1 tablet (15 mg total) by mouth daily. 30 tablet 0   methylPREDNISolone  (MEDROL  DOSEPAK) 4 MG TBPK tablet Take Tapered dose as directed 21 tablet 0   Omega-3 1000 MG CAPS Take by mouth.     omeprazole (PRILOSEC) 40 MG capsule Take by mouth.      pravastatin (PRAVACHOL) 20 MG tablet Take 20 mg by mouth daily.     promethazine -dextromethorphan (PROMETHAZINE -DM) 6.25-15 MG/5ML syrup Take 5 mLs by mouth 4 (four) times daily as needed for cough. 118 mL 0   pseudoephedrine-acetaminophen  (TYLENOL  SINUS) 30-500 MG TABS tablet Take 1 tablet by mouth every 4 (four) hours as needed.     ranitidine (ZANTAC) 150 MG tablet Take by mouth.     No current facility-administered medications for this visit.    Patient confirms/reports the following allergies:  Allergies  Allergen Reactions   Penicillins Anaphylaxis   Sulfa Antibiotics Anaphylaxis   Sulfasalazine Anaphylaxis    No orders of the defined types were placed in this encounter.   AUTHORIZATION INFORMATION Primary Insurance: 1D#: Group #:  Secondary Insurance: 1D#: Group #:  SCHEDULE INFORMATION: Date:  Time: Location:

## 2023-11-20 NOTE — Telephone Encounter (Signed)
 Gastroenterology Pre-Procedure Review   Request Date: 02/27/24 Requesting Physician: Dr. Tully Gainer   PATIENT REVIEW QUESTIONS: The patient responded to the following health history questions as indicated:     1. Are you having any GI issues? no 2. Do you have a personal history of Polyps? Yes last colonoscopy performed by Dr Tully Gainer 01/15/21 recommended repeat in 3 years 3. Do you have a family history of Colon Cancer or Polyps? yes (Aunt colon polyps) 4. Diabetes Mellitus? no 5. Joint replacements in the past 12 months?no 6. Major health problems in the past 3 months?no 7. Any artificial heart valves, MVP, or defibrillator?no    MEDICATIONS & ALLERGIES:    Patient reports the following regarding taking any anticoagulation/antiplatelet therapy:   Plavix, Coumadin, Eliquis, Xarelto, Lovenox, Pradaxa, Brilinta, or Effient? no Aspirin? yes (81 mg)    Patient confirms/reports the following medications:  Current Outpatient Medications  Medication Sig Dispense Refill   amLODipine (NORVASC) 5 MG tablet Take 5 mg by mouth daily.     aspirin 81 MG EC tablet Take by mouth.     carvedilol (COREG) 12.5 MG tablet Take 12.5 mg by mouth 2 (two) times daily with a meal.     clotrimazole -betamethasone  (LOTRISONE ) cream Apply 1 application topically 2 (two) times daily. 30 g 0   dicyclomine (BENTYL) 20 MG tablet Take 20 mg by mouth 4 (four) times daily as needed.     doxycycline  (VIBRA -TABS) 100 MG tablet Take 1 tablet (100 mg total) by mouth 2 (two) times daily. 20 tablet 0   HYDROcodone -acetaminophen  (NORCO/VICODIN) 5-325 MG tablet Take 1 tablet by mouth every 4 (four) hours as needed for moderate pain. 10 tablet 0   meloxicam  (MOBIC ) 15 MG tablet Take 1 tablet (15 mg total) by mouth daily. 30 tablet 0   methylPREDNISolone  (MEDROL  DOSEPAK) 4 MG TBPK tablet Take Tapered dose as directed 21 tablet 0   Omega-3 1000 MG CAPS Take by mouth.     omeprazole (PRILOSEC) 40 MG capsule Take by mouth.      polyethylene glycol-electrolytes (NULYTELY) 420 g solution Take 4,000 mLs by mouth once for 1 dose. 4000 mL 0   pravastatin (PRAVACHOL) 20 MG tablet Take 20 mg by mouth daily.     promethazine -dextromethorphan (PROMETHAZINE -DM) 6.25-15 MG/5ML syrup Take 5 mLs by mouth 4 (four) times daily as needed for cough. 118 mL 0   pseudoephedrine-acetaminophen  (TYLENOL  SINUS) 30-500 MG TABS tablet Take 1 tablet by mouth every 4 (four) hours as needed.     ranitidine (ZANTAC) 150 MG tablet Take by mouth.     No current facility-administered medications for this visit.    Patient confirms/reports the following allergies:  Allergies  Allergen Reactions   Penicillins Anaphylaxis   Sulfa Antibiotics Anaphylaxis   Sulfasalazine Anaphylaxis    No orders of the defined types were placed in this encounter.   AUTHORIZATION INFORMATION Primary Insurance: 1D#: Group #:  Secondary Insurance: 1D#: Group #:  SCHEDULE INFORMATION: Date: 089/16/25 Time: Location: [facility]

## 2024-01-08 MED ORDER — PEG 3350-KCL-NA BICARB-NACL 420 G PO SOLR: 4000.0000 mL | Freq: Once | ORAL | 0 refills | Status: AC

## 2024-01-08 NOTE — Addendum Note (Signed)
 Addended by: LANNIE ANDREA GRADE on: 01/08/2024 09:13 AM   Modules accepted: Orders

## 2024-01-23 ENCOUNTER — Other Ambulatory Visit: Payer: Self-pay | Admitting: Family Medicine

## 2024-01-23 ENCOUNTER — Inpatient Hospital Stay
Admission: RE | Admit: 2024-01-23 | Discharge: 2024-01-23 | Disposition: A | Discharge status: Home / Self Care | Payer: Self-pay | Source: Ambulatory Visit | Attending: Neurosurgery | Admitting: Neurosurgery

## 2024-01-23 ENCOUNTER — Telehealth: Payer: Self-pay | Admitting: Gastroenterology

## 2024-01-23 DIAGNOSIS — Z049 Encounter for examination and observation for unspecified reason: Secondary | ICD-10-CM

## 2024-01-23 NOTE — Telephone Encounter (Signed)
 Pt requesting call back to reschedule colonoscopy [phone]

## 2024-01-24 ENCOUNTER — Encounter: Payer: Self-pay | Admitting: Neurosurgery

## 2024-01-24 NOTE — Progress Notes (Unsigned)
 Referring Physician:  Sharrie Prentice PARAS, DO [address] [place],  [place] [address]  Primary Physician:  Adina Buel HERO, MD  History of Present Illness: 01/24/2024 [name] is here today with a chief complaint of ***  Back pain radiates to bilateral buttock and bilateral thighs at times and down to both legs and feet off and on? Weakness in legs present still?   Duration: *** Location: *** Quality: *** Severity: ***  Precipitating: aggravated by *** Modifying factors: made better by *** Weakness: none Timing: *** Bowel/Bladder Dysfunction: none  Conservative measures:  Physical therapy: *** has not participated in but referral was placed? Multimodal medical therapy including regular antiinflammatories: *** Prednisone, Flexeril, Gabapentin, Tylenol , Meloxicam , Etodolac Injections:  01/16/2024: Bilateral L5-S1 transforaminal ESI 04/27/2017: Bilateral S1 transforaminal ESI 03/20/2017: Bilateral S1 transforaminal ESI   Past Surgery: ***no spine surgery  Debra Delacruz has ***no symptoms of cervical myelopathy.  The symptoms are causing a significant impact on the patient's life.   I have utilized the care everywhere function in epic to review the outside records available from external health systems.   Discussed the use of AI scribe software for clinical note transcription with the patient, who gave verbal consent to proceed.       Review of Systems:  A 10 point review of systems is negative, except for the pertinent positives and negatives detailed in the HPI.  Past Medical History: Past Medical History:  Diagnosis Date   Hypercholesteremia    Hypertension     Past Surgical History: Past Surgical History:  Procedure Laterality Date   BREAST BIOPSY     BREAST BIOPSY Left    benign-years ago   CESAREAN SECTION     COLONOSCOPY WITH PROPOFOL  N/A 01/15/2021   Procedure: COLONOSCOPY WITH PROPOFOL ;  Surgeon: Janalyn Keene NOVAK, MD;  Location:  [facility] ENDOSCOPY;  Service: Endoscopy;  Laterality: N/A;   HERNIA REPAIR      Allergies: Allergies as of 01/30/2024 - Review Complete 11/20/2023  Allergen Reaction Noted   Penicillins Anaphylaxis 01/07/2015   Sulfa antibiotics Anaphylaxis 01/07/2015   Sulfasalazine Anaphylaxis 01/07/2015    Medications:  Current Outpatient Medications:    amLODipine (NORVASC) 5 MG tablet, Take 5 mg by mouth daily., Disp: , Rfl:    aspirin 81 MG EC tablet, Take by mouth., Disp: , Rfl:    carvedilol (COREG) 12.5 MG tablet, Take 12.5 mg by mouth 2 (two) times daily with a meal., Disp: , Rfl:    clotrimazole -betamethasone  (LOTRISONE ) cream, Apply 1 application topically 2 (two) times daily., Disp: 30 g, Rfl: 0   dicyclomine (BENTYL) 20 MG tablet, Take 20 mg by mouth 4 (four) times daily as needed., Disp: , Rfl:    doxycycline  (VIBRA -TABS) 100 MG tablet, Take 1 tablet (100 mg total) by mouth 2 (two) times daily., Disp: 20 tablet, Rfl: 0   HYDROcodone -acetaminophen  (NORCO/VICODIN) 5-325 MG tablet, Take 1 tablet by mouth every 4 (four) hours as needed for moderate pain., Disp: 10 tablet, Rfl: 0   meloxicam  (MOBIC ) 15 MG tablet, Take 1 tablet (15 mg total) by mouth daily., Disp: 30 tablet, Rfl: 0   methylPREDNISolone  (MEDROL  DOSEPAK) 4 MG TBPK tablet, Take Tapered dose as directed, Disp: 21 tablet, Rfl: 0   Omega-3 1000 MG CAPS, Take by mouth., Disp: , Rfl:    omeprazole (PRILOSEC) 40 MG capsule, Take by mouth., Disp: , Rfl:    pravastatin (PRAVACHOL) 20 MG tablet, Take 20 mg by mouth daily., Disp: ,  Rfl:    promethazine -dextromethorphan (PROMETHAZINE -DM) 6.25-15 MG/5ML syrup, Take 5 mLs by mouth 4 (four) times daily as needed for cough., Disp: 118 mL, Rfl: 0   pseudoephedrine-acetaminophen  (TYLENOL  SINUS) 30-500 MG TABS tablet, Take 1 tablet by mouth every 4 (four) hours as needed., Disp: , Rfl:    ranitidine (ZANTAC) 150 MG tablet, Take by mouth., Disp: , Rfl:   Social History: Social History   Tobacco Use    Smoking status: Never   Smokeless tobacco: Never  Vaping Use   Vaping status: Never Used  Substance Use Topics   Alcohol use: No   Drug use: No    Family Medical History: Family History  Problem Relation Age of Onset   Breast cancer Neg Hx     Physical Examination: There were no vitals filed for this visit.  General: Patient is in no apparent distress. Attention to examination is appropriate.  Neck:   Supple.  Full range of motion.  Respiratory: Patient is breathing without any difficulty.   NEUROLOGICAL:     Awake, alert, oriented to person, place, and time.  Speech is clear and fluent.   Cranial Nerves: Pupils equal round and reactive to light.  Facial tone is symmetric.  Facial sensation is symmetric. Shoulder shrug is symmetric. Tongue protrusion is midline.  There is no pronator drift.  Strength: Side Biceps Triceps Deltoid Interossei Grip Wrist Ext. Wrist Flex.  R 5 5 5 5 5 5 5   L 5 5 5 5 5 5 5    Side Iliopsoas Quads Hamstring PF DF EHL  R 5 5 5 5 5 5   L 5 5 5 5 5 5    Reflexes are ***2+ and symmetric at the biceps, triceps, brachioradialis, patella and achilles.   Hoffman's is absent.   Bilateral upper and lower extremity sensation is intact to light touch.    No evidence of dysmetria noted.  Gait is normal.     Medical Decision Making  Imaging: ***  I have personally reviewed the images and agree with the above interpretation.  Assessment and Plan: [name] is a pleasant 73 y.o. female with ***      Thank you for involving me in the care of this patient.      Chester K. Clois MD, [facility] Neurosurgery

## 2024-01-30 ENCOUNTER — Encounter: Payer: Self-pay | Admitting: Neurosurgery

## 2024-01-30 ENCOUNTER — Ambulatory Visit (INDEPENDENT_AMBULATORY_CARE_PROVIDER_SITE_OTHER): Admitting: Neurosurgery

## 2024-01-30 VITALS — BP 132/86 | Ht 67.0 in | Wt 249.0 lb

## 2024-01-30 DIAGNOSIS — M48062 Spinal stenosis, lumbar region with neurogenic claudication: Secondary | ICD-10-CM | POA: Diagnosis not present

## 2024-04-02 ENCOUNTER — Ambulatory Visit: Admitting: Neurosurgery

## 2024-04-10 ENCOUNTER — Other Ambulatory Visit: Payer: Self-pay | Admitting: Family Medicine

## 2024-04-10 DIAGNOSIS — Z1231 Encounter for screening mammogram for malignant neoplasm of breast: Secondary | ICD-10-CM

## 2024-04-15 ENCOUNTER — Ambulatory Visit: Admitting: Anesthesiology

## 2024-04-15 ENCOUNTER — Encounter: Payer: Self-pay | Admitting: Gastroenterology

## 2024-04-15 ENCOUNTER — Encounter
Admission: RE | Disposition: A | Discharge status: Home / Self Care | Payer: Self-pay | Source: Home / Self Care | Attending: Gastroenterology

## 2024-04-15 ENCOUNTER — Ambulatory Visit
Admission: RE | Admit: 2024-04-15 | Discharge: 2024-04-15 | Disposition: A | Discharge status: Home / Self Care | Attending: Gastroenterology | Admitting: Gastroenterology

## 2024-04-15 DIAGNOSIS — G709 Myoneural disorder, unspecified: Secondary | ICD-10-CM | POA: Diagnosis not present

## 2024-04-15 DIAGNOSIS — I10 Essential (primary) hypertension: Secondary | ICD-10-CM | POA: Insufficient documentation

## 2024-04-15 DIAGNOSIS — Z1211 Encounter for screening for malignant neoplasm of colon: Secondary | ICD-10-CM | POA: Diagnosis not present

## 2024-04-15 DIAGNOSIS — K219 Gastro-esophageal reflux disease without esophagitis: Secondary | ICD-10-CM | POA: Insufficient documentation

## 2024-04-15 DIAGNOSIS — K5730 Diverticulosis of large intestine without perforation or abscess without bleeding: Secondary | ICD-10-CM

## 2024-04-15 DIAGNOSIS — Z860101 Personal history of adenomatous and serrated colon polyps: Secondary | ICD-10-CM

## 2024-04-15 DIAGNOSIS — K640 First degree hemorrhoids: Secondary | ICD-10-CM | POA: Diagnosis not present

## 2024-04-15 DIAGNOSIS — Z79899 Other long term (current) drug therapy: Secondary | ICD-10-CM | POA: Insufficient documentation

## 2024-04-15 DIAGNOSIS — Z860100 Personal history of colon polyps, unspecified: Secondary | ICD-10-CM

## 2024-04-15 HISTORY — PX: COLONOSCOPY: SHX5424

## 2024-04-15 SURGERY — COLONOSCOPY
Anesthesia: General

## 2024-04-15 MED ORDER — LIDOCAINE HCL (PF) 2 % IJ SOLN
INTRAMUSCULAR | Status: AC
Start: 2024-04-15 — End: 2024-04-15
  Filled 2024-04-15: qty 5

## 2024-04-15 MED ORDER — SODIUM CHLORIDE 0.9 % IV SOLN: INTRAVENOUS | Status: DC

## 2024-04-15 MED ORDER — PROPOFOL 500 MG/50ML IV EMUL
INTRAVENOUS | Status: DC | PRN
  Administered 2024-04-15: 75 ug/kg/min via INTRAVENOUS

## 2024-04-15 MED ORDER — DEXMEDETOMIDINE HCL IN NACL 80 MCG/20ML IV SOLN
INTRAVENOUS | Status: DC | PRN
Start: 2024-04-15 — End: 2024-04-15
  Administered 2024-04-15: 12 ug via INTRAVENOUS
  Administered 2024-04-15: 8 ug via INTRAVENOUS

## 2024-04-15 MED ORDER — LIDOCAINE HCL (CARDIAC) PF 100 MG/5ML IV SOSY
PREFILLED_SYRINGE | INTRAVENOUS | Status: DC | PRN
  Administered 2024-04-15: 60 mg via INTRAVENOUS

## 2024-04-15 MED ORDER — PROPOFOL 10 MG/ML IV BOLUS
INTRAVENOUS | Status: DC | PRN
  Administered 2024-04-15 (×2): 50 mg via INTRAVENOUS

## 2024-04-15 NOTE — Transfer of Care (Signed)
 Immediate Anesthesia Transfer of Care Note  Patient: Debra Delacruz  Procedure(s) Performed: COLONOSCOPY  Patient Location: PACU  Anesthesia Type:General  Level of Consciousness: sedated  Airway & Oxygen Therapy: Patient Spontanous Breathing  Post-op Assessment: Report given to RN and Post -op Vital signs reviewed and stable  Post vital signs: Reviewed and stable  Last Vitals:  Vitals Value Taken Time  BP 104/67 04/15/24 09:15  Temp    Pulse 75 04/15/24 09:15  Resp 15 04/15/24 09:15  SpO2 98 % 04/15/24 09:15  Vitals shown include unfiled device data.  Last Pain:  Vitals:   04/15/24 0838  TempSrc: Temporal         Complications: No notable events documented.

## 2024-04-15 NOTE — Op Note (Signed)
 [facility] Gastroenterology Patient Name: Debra Delacruz Procedure Date: 04/15/2024 8:36 AM MRN: [id number] Account #: [id number] Date of Birth: 02/15/1951 Admit Type: Outpatient Age: 73 Room: [facility] ENDO ROOM 4 Gender: Female Note Status: Finalized Instrument Name: Colon Scope [id number] Procedure:             Colonoscopy Indications:           High risk colon cancer surveillance: Personal history                         of colonic polyps Providers:             [name] MD, MD Referring MD:          Buel HERO. Adamo (Referring MD) Medicines:             Propofol  per Anesthesia Complications:         No immediate complications. Procedure:             Pre-Anesthesia Assessment:                        - Prior to the procedure, a History and Physical was                         performed, and patient medications and allergies were                         reviewed. The patient's tolerance of previous                         anesthesia was also reviewed. The risks and benefits                         of the procedure and the sedation options and risks                         were discussed with the patient. All questions were                         answered, and informed consent was obtained. Prior                         Anticoagulants: The patient has taken no anticoagulant                         or antiplatelet agents. ASA Grade Assessment: II - A                         patient with mild systemic disease. After reviewing                         the risks and benefits, the patient was deemed in                         satisfactory condition to undergo the procedure.                        After obtaining informed consent, the colonoscope was  passed under direct vision. Throughout the procedure,                         the patient's blood pressure, pulse, and oxygen                         saturations were monitored continuously. The                          Colonoscope was introduced through the anus and                         advanced to the the cecum, identified by appendiceal                         orifice and ileocecal valve. The colonoscopy was                         performed without difficulty. The patient tolerated                         the procedure well. The quality of the bowel                         preparation was excellent. Findings:      The perianal and digital rectal examinations were normal.      Multiple small-mouthed diverticula were found in the sigmoid colon and       descending colon.      Non-bleeding internal hemorrhoids were found during retroflexion. The       hemorrhoids were Grade I (internal hemorrhoids that do not prolapse). Impression:            - Diverticulosis in the sigmoid colon and in the                         descending colon.                        - Non-bleeding internal hemorrhoids.                        - No specimens collected. Recommendation:        - Discharge patient to home.                        - Resume previous diet.                        - Continue present medications.                        - Repeat colonoscopy is not recommended for                         surveillance. Procedure Code(s):     --- Professional ---                        [phone], Colonoscopy, flexible; diagnostic, including  collection of specimen(s) by brushing or washing, when                         performed (separate procedure) Diagnosis Code(s):     --- Professional ---                        Z86.010, Personal history of colonic polyps CPT copyright 2022 American Medical Association. All rights reserved. The codes documented in this report are preliminary and upon coder review may  be revised to meet current compliance requirements. [name] MD, MD 04/15/2024 9:12:11 AM This report has been signed electronically. Number of Addenda: 0 Note Initiated On: 04/15/2024 8:36  AM Scope Withdrawal Time: 0 hours 6 minutes 46 seconds  Total Procedure Duration: 0 hours 12 minutes 6 seconds  Estimated Blood Loss:  Estimated blood loss: none.      [facility]

## 2024-04-15 NOTE — Anesthesia Postprocedure Evaluation (Signed)
 Anesthesia Post Note  Patient: Debra Delacruz  Procedure(s) Performed: COLONOSCOPY  Patient location during evaluation: Endoscopy Anesthesia Type: General Level of consciousness: awake and alert Pain management: pain level controlled Vital Signs Assessment: post-procedure vital signs reviewed and stable Respiratory status: spontaneous breathing, nonlabored ventilation, respiratory function stable and patient connected to nasal cannula oxygen Cardiovascular status: blood pressure returned to baseline and stable Postop Assessment: no apparent nausea or vomiting Anesthetic complications: no   No notable events documented.   Last Vitals:  Vitals:   04/15/24 0915 04/15/24 0925  BP: 104/67 104/88  Pulse: 78 70  Resp: (!) 24 17  Temp: (!) 35.6 C   SpO2: 98% 100%    Last Pain:  Vitals:   04/15/24 0915  TempSrc: Temporal  PainSc: Asleep                 [name]

## 2024-04-15 NOTE — Anesthesia Preprocedure Evaluation (Signed)
 Anesthesia Evaluation  Patient identified by MRN, date of birth, ID band Patient awake    Reviewed: Allergy & Precautions, NPO status , Patient's Chart, lab work & pertinent test results  History of Anesthesia Complications Negative for: history of anesthetic complications  Airway Mallampati: I  TM Distance: >3 FB Neck ROM: full    Dental  (+) Missing   Pulmonary neg pulmonary ROS   Pulmonary exam normal        Cardiovascular hypertension, On Medications negative cardio ROS Normal cardiovascular exam     Neuro/Psych  Neuromuscular disease  negative psych ROS   GI/Hepatic Neg liver ROS,GERD  Medicated,,  Endo/Other  negative endocrine ROS    Renal/GU negative Renal ROS  negative genitourinary   Musculoskeletal   Abdominal   Peds  Hematology negative hematology ROS (+)   Anesthesia Other Findings Past Medical History: No date: Acid reflux No date: Baker cyst No date: Cecal polyp No date: Hypercholesteremia No date: Hypertension No date: Spinal stenosis  Past Surgical History: No date: BREAST BIOPSY No date: BREAST BIOPSY; Left     Comment:  benign-years ago No date: CESAREAN SECTION 01/15/2021: COLONOSCOPY WITH PROPOFOL ; N/A     Comment:  Procedure: COLONOSCOPY WITH PROPOFOL ;  Surgeon:               Janalyn Keene NOVAK, MD;  Location: [facility] ENDOSCOPY;                Service: Endoscopy;  Laterality: N/A; No date: HERNIA REPAIR     Reproductive/Obstetrics negative OB ROS                              Anesthesia Physical Anesthesia Plan  ASA: 2  Anesthesia Plan: General   Post-op Pain Management: Minimal or no pain anticipated   Induction: Intravenous  PONV Risk Score and Plan: 2 and Propofol  infusion and TIVA  Airway Management Planned: Natural Airway and Nasal Cannula  Additional Equipment:   Intra-op Plan:   Post-operative Plan:   Informed Consent: I have  reviewed the patients History and Physical, chart, labs and discussed the procedure including the risks, benefits and alternatives for the proposed anesthesia with the patient or authorized representative who has indicated his/her understanding and acceptance.     Dental Advisory Given  Plan Discussed with: Anesthesiologist, CRNA and Surgeon  Anesthesia Plan Comments: (Patient consented for risks of anesthesia including but not limited to:  - adverse reactions to medications - risk of airway placement if required - damage to eyes, teeth, lips or other oral mucosa - nerve damage due to positioning  - sore throat or hoarseness - Damage to heart, brain, nerves, lungs, other parts of body or loss of life  Patient voiced understanding and assent.)        Anesthesia Quick Evaluation

## 2024-04-15 NOTE — H&P (Signed)
 Debra Copping, MD [facility] [address] [place], [place] [address] Phone:[phone] Fax : [phone]  Primary Care Physician:  Adina Buel HERO, MD Primary Gastroenterologist:  Dr. Copping  Pre-Procedure History & Physical: HPI:  Debra Delacruz is a 73 y.o. female is here for an colonoscopy.   Past Medical History:  Diagnosis Date   Acid reflux    Baker cyst    Cecal polyp    Hypercholesteremia    Hypertension    Spinal stenosis     Past Surgical History:  Procedure Laterality Date   BREAST BIOPSY     BREAST BIOPSY Left    benign-years ago   CESAREAN SECTION     COLONOSCOPY WITH PROPOFOL  N/A 01/15/2021   Procedure: COLONOSCOPY WITH PROPOFOL ;  Surgeon: Janalyn Keene NOVAK, MD;  Location: [facility] ENDOSCOPY;  Service: Endoscopy;  Laterality: N/A;   HERNIA REPAIR      Prior to Admission medications   Medication Sig Start Date End Date Taking? Authorizing Provider  acetaminophen  (TYLENOL ) 500 MG tablet Take 1,000 mg by mouth.    [provider]  amLODipine (NORVASC) 5 MG tablet Take 5 mg by mouth daily.    [provider]  aspirin 81 MG EC tablet Take by mouth.    [provider]  carvedilol (COREG) 12.5 MG tablet Take 12.5 mg by mouth 2 (two) times daily with a meal.    [provider]  clotrimazole -betamethasone  (LOTRISONE ) cream Apply 1 application topically 2 (two) times daily. 03/26/20   Tobie Franky SQUIBB, DPM    Allergies as of 11/20/2023 - Review Complete 11/20/2023  Allergen Reaction Noted   Penicillins Anaphylaxis 01/07/2015   Sulfa antibiotics Anaphylaxis 01/07/2015   Sulfasalazine Anaphylaxis 01/07/2015    Family History  Problem Relation Age of Onset   Breast cancer Neg Hx     Social History   Socioeconomic History   Marital status: Divorced    Spouse name: Not on file   Number of children: Not on file   Years of education: Not on file   Highest education level: Not on file  Occupational History   Not on file   Tobacco Use   Smoking status: Never   Smokeless tobacco: Never  Vaping Use   Vaping status: Never Used  Substance and Sexual Activity   Alcohol use: No   Drug use: No   Sexual activity: Not on file  Other Topics Concern   Not on file  Social History Narrative   Not on file   Social Drivers of Health   Financial Resource Strain: Low Risk  (02/14/2024)   Received from [facility]   Overall Financial Resource Strain (CARDIA)    Difficulty of Paying Living Expenses: Not hard at all  Food Insecurity: No Food Insecurity (02/14/2024)   Received from [facility]   Hunger Vital Sign    Within the past 12 months, you worried that your food would run out before you got the money to buy more.: Never true    Within the past 12 months, the food you bought just didn't last and you didn't have money to get more.: Never true  Transportation Needs: No Transportation Needs (02/14/2024)   Received from [facility] - Transportation    In the past 12 months, has lack of transportation kept you from medical appointments or from getting medications?: No    Lack of Transportation (Non-Medical): No  Physical Activity: Not on file  Stress: Not on file  Social Connections: Not on file  Intimate Partner Violence: Not on file    Review of Systems: See HPI, otherwise negative ROS  Physical Exam: There were no vitals taken for this visit. General:   Alert,  pleasant and cooperative in NAD Head:  Normocephalic and atraumatic. Neck:  Supple; no masses or thyromegaly. Lungs:  Clear throughout to auscultation.    Heart:  Regular rate and rhythm. Abdomen:  Soft, nontender and nondistended. Normal bowel sounds, without guarding, and without rebound.   Neurologic:  Alert and  oriented x4;  grossly normal neurologically.  Impression/Plan: Debra Delacruz is here for an colonoscopy to be performed for a history of adenomatous polyps on   2022  Risks, benefits, limitations, and alternatives regarding  colonoscopy have been reviewed with the patient.  Questions have been answered.  All parties agreeable.   Debra Copping, MD  04/15/2024, 8:33 AM

## 2024-04-16 ENCOUNTER — Ambulatory Visit: Admitting: Neurosurgery

## 2024-05-01 ENCOUNTER — Ambulatory Visit: Admitting: Pain Medicine

## 2024-05-08 ENCOUNTER — Telehealth: Payer: Self-pay | Admitting: Neurosurgery

## 2024-05-08 DIAGNOSIS — M48062 Spinal stenosis, lumbar region with neurogenic claudication: Secondary | ICD-10-CM

## 2024-05-08 NOTE — Telephone Encounter (Signed)
 Patient called to request an updated referral be sent to [facility] on [address]. She is scheduled for an appointment with their office next week.

## 2024-05-08 NOTE — Telephone Encounter (Signed)
 Patient was rescheduled per chart

## 2024-05-08 NOTE — Telephone Encounter (Signed)
 Called [facility] and spoke with Randine to follow up, patient was originally referred in August but per their office patient kept cancelling and rescheduling, or cancelling after the day of the appointment. Her first initial appointment is now on 05/15/2024. Referral placed and faxed to them at [phone]

## 2024-05-08 NOTE — Telephone Encounter (Signed)
 Left message for the patient to be advised and need to reschedule follow up with Dr Clois since no PT has been done yet.

## 2024-05-14 ENCOUNTER — Encounter

## 2024-05-28 ENCOUNTER — Ambulatory Visit: Admitting: Neurosurgery

## 2024-06-04 ENCOUNTER — Encounter

## 2024-06-25 ENCOUNTER — Ambulatory Visit: Admitting: Neurosurgery

## 2024-07-01 ENCOUNTER — Encounter

## 2024-07-09 ENCOUNTER — Ambulatory Visit: Admitting: Neurosurgery

## 2024-08-06 ENCOUNTER — Ambulatory Visit: Admitting: Neurosurgery
